# Patient Record
Sex: Female | Born: 1980 | Race: White | Hispanic: No | Marital: Married | Smoking: Never smoker
Health system: Southern US, Community
[De-identification: ages and names within clinical notes are randomized; demographics above are authoritative.]

## PROBLEM LIST (undated history)

## (undated) DIAGNOSIS — Z923 Personal history of irradiation: Secondary | ICD-10-CM

## (undated) DIAGNOSIS — T8859XA Other complications of anesthesia, initial encounter: Secondary | ICD-10-CM

## (undated) DIAGNOSIS — T4145XA Adverse effect of unspecified anesthetic, initial encounter: Secondary | ICD-10-CM

## (undated) DIAGNOSIS — R222 Localized swelling, mass and lump, trunk: Secondary | ICD-10-CM

---

## 2009-10-07 HISTORY — PX: LAPAROTOMY: SHX154

## 2010-08-17 ENCOUNTER — Ambulatory Visit (HOSPITAL_COMMUNITY): Admission: RE | Admit: 2010-08-17 | Discharge: 2010-08-17 | Payer: Self-pay

## 2011-03-20 IMAGING — US US FOLLICLE
1 series · 14 of 16 positions shown · non-contrast
Comparison: None.

CLINICAL DATA: Follicle study.  LMP 08/15/2010.  Not currently on
hormonal replacement or fertility medication

US PELVIS LIMITED OR FOLLOW UP

[Series 1: us follicle · 0.13mm/px · 27 acquisitions, 14 frames shown]
[im 1/27]
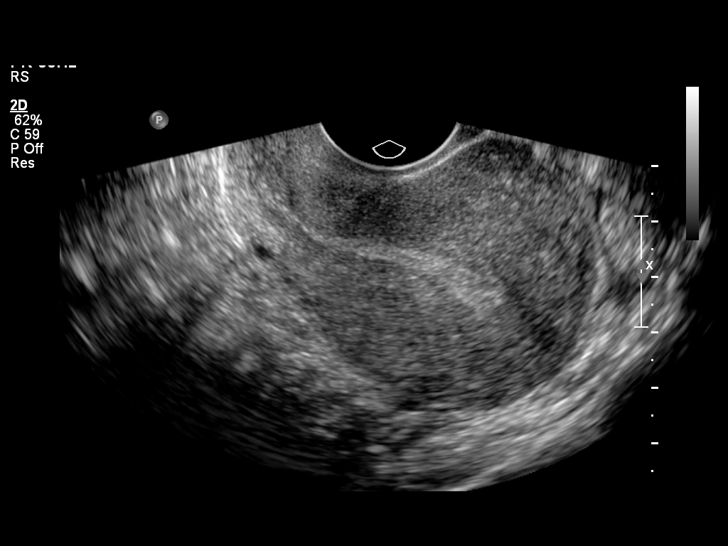
[im 2/27]
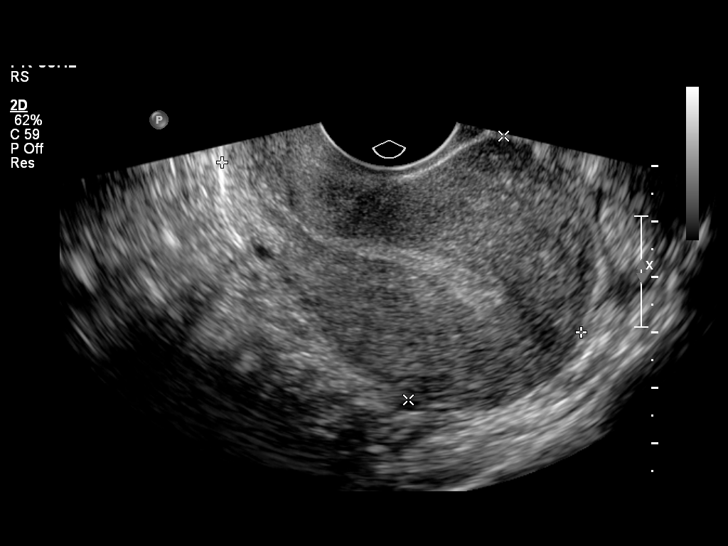
[im 4/27]
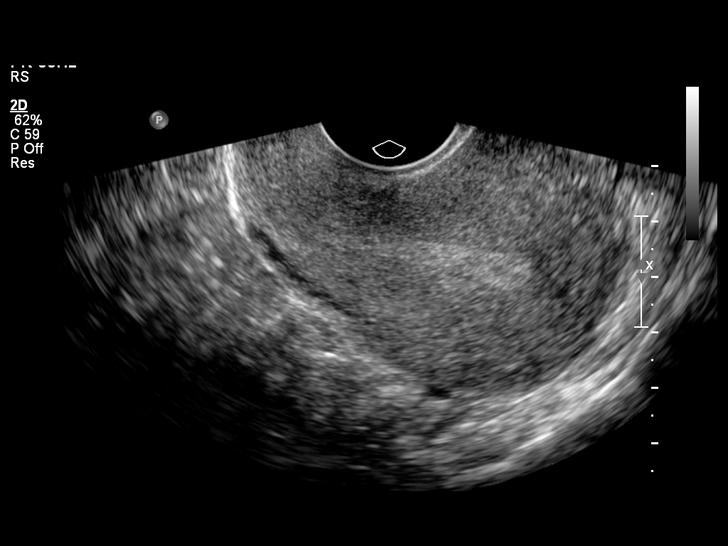
[im 7/27]
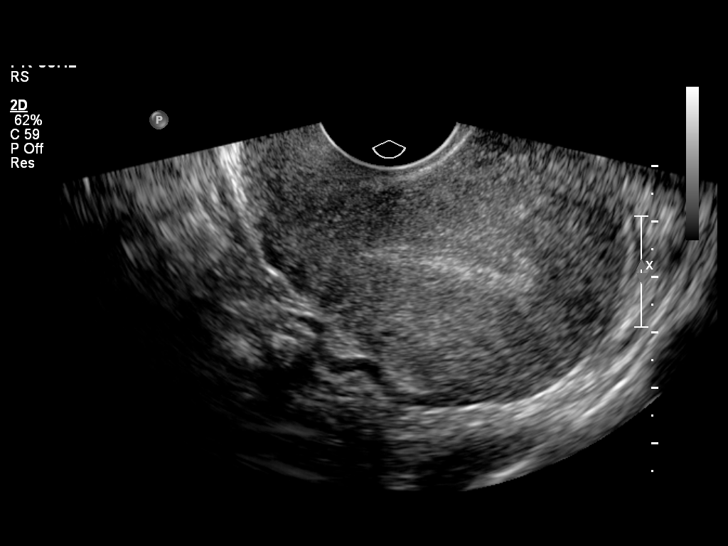
[im 9/27]
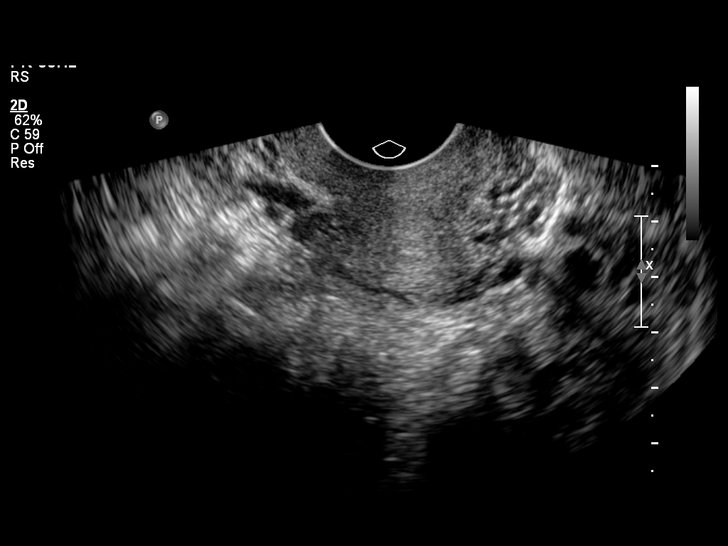
[im 11/27]
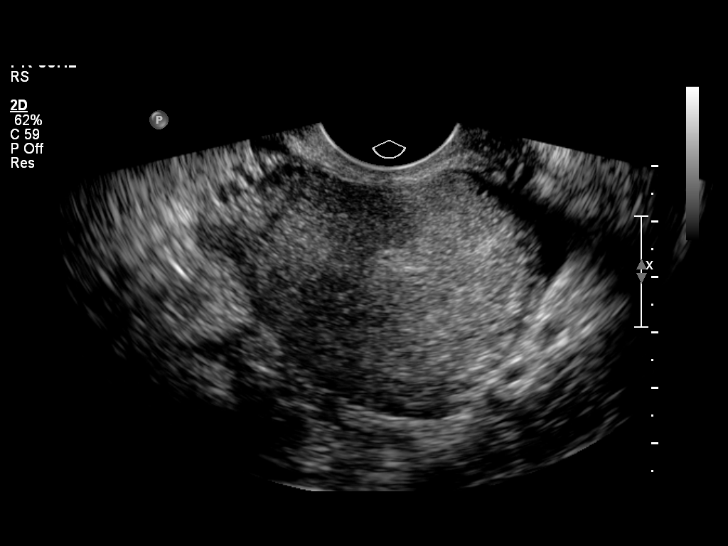
[im 13/27]
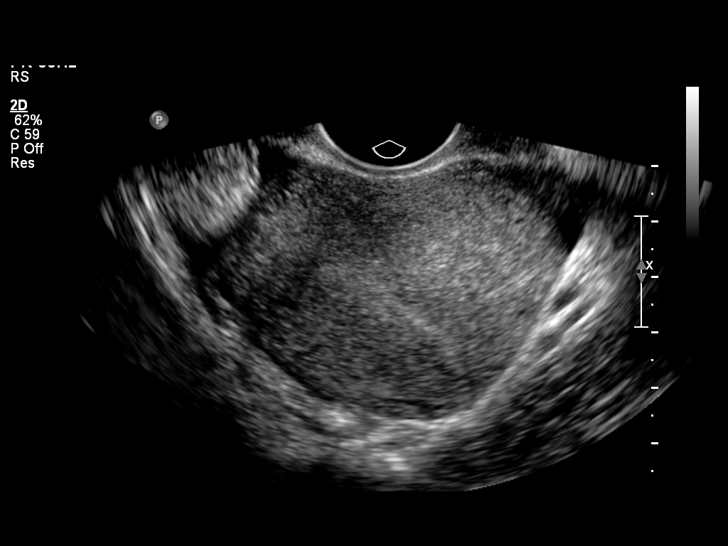
[im 14/27]
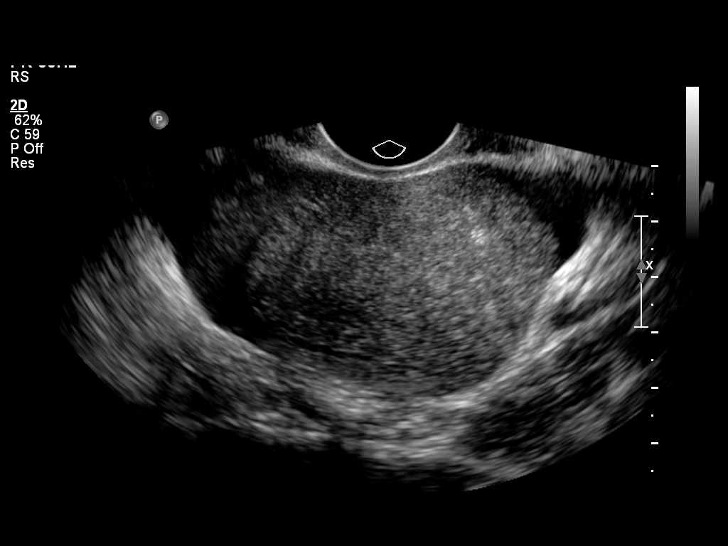
[im 16/27]
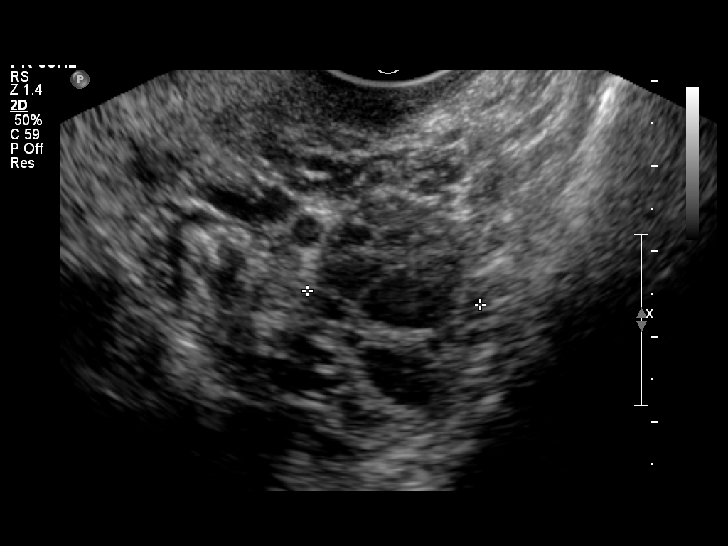
[im 18/27]
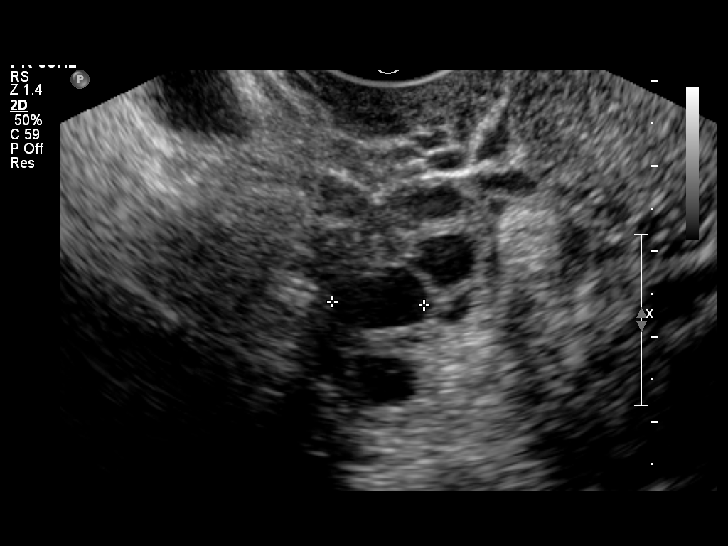
[im 21/27]
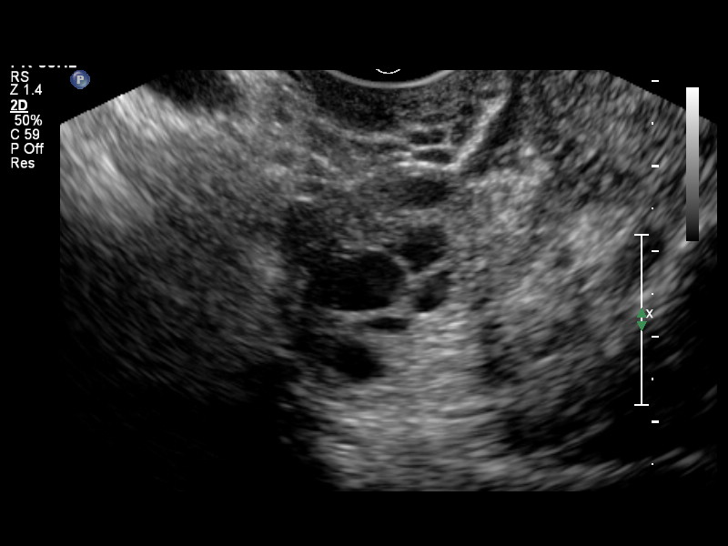
[im 23/27]
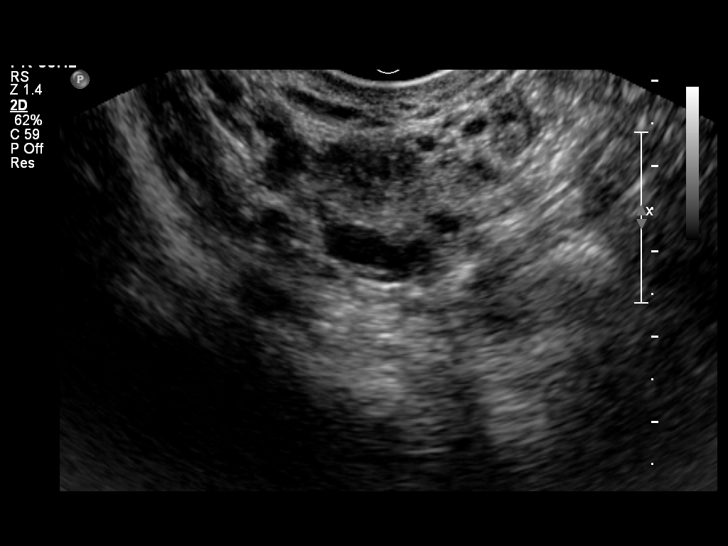
[im 25/27]
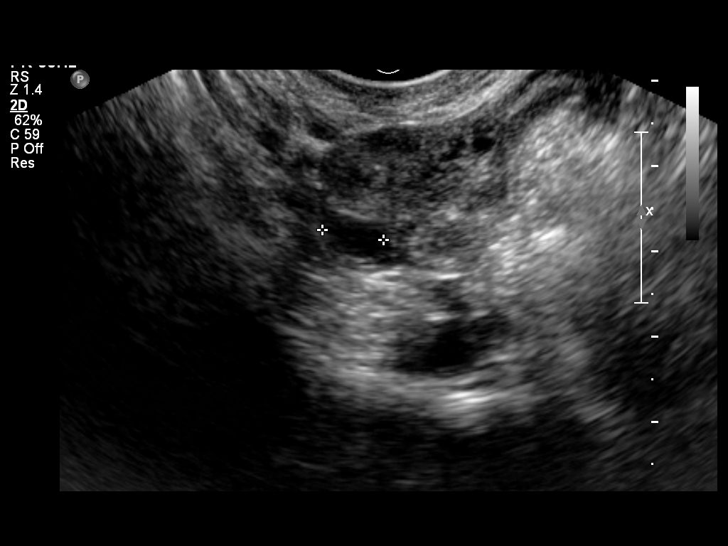
[im 27/27]
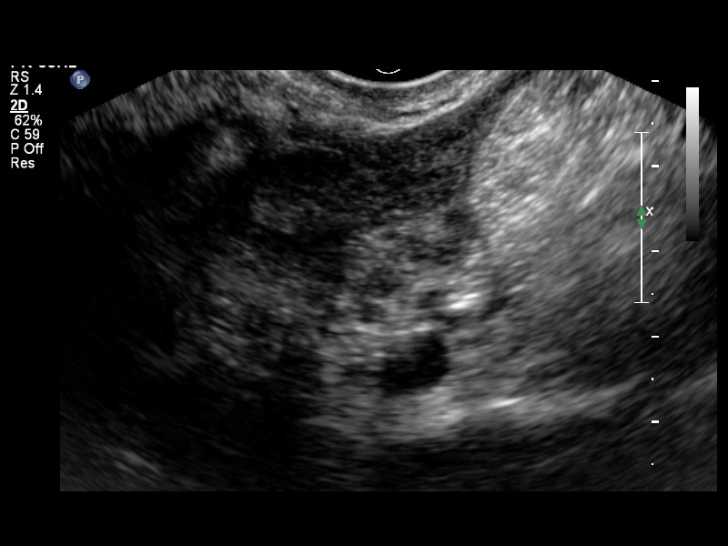

[14 of 16 positions shown; findings below may reference images not displayed]

FINDINGS: The uterus is retroverted in nature and demonstrates a
sagittal length of 7.2 cm, an AP depth of 5.1 cm and a transverse
width of 5.2 cm.  A homogeneous uterine myometrium is seen.

The endometrial lining is homogeneously echogenic with an AP width
of 7 mm.  No areas of focal thickening or heterogeneity are seen
and this would correlate with the patient's current menses.

The left ovary measures 2.9 x 1.8 x 1.9 cm and contains one
measurable follicle measuring 11 x 9 mm.  Several sub centimeter
follicles (6-7mm) are noted.

The right ovary measures 2.9 x 1.82 x 1.9 cm and contains three to
four sub centimeter follicles.

No pelvic fluid or separate adnexal masses are seen.
IMPRESSION: Normal secretory uterine myometrium and endometrium.

Normal ovaries with bilateral sub centimeter follicles.  One
measurable follicle noted on the left with size as described above.

## 2014-09-05 ENCOUNTER — Telehealth (INDEPENDENT_AMBULATORY_CARE_PROVIDER_SITE_OTHER): Payer: Self-pay

## 2014-09-05 DIAGNOSIS — R1900 Intra-abdominal and pelvic swelling, mass and lump, unspecified site: Secondary | ICD-10-CM

## 2014-09-05 NOTE — Telephone Encounter (Signed)
Pt seen in office today by Dr Dalbert Batman and orders placed in epic for abdominal u/s to eval. Lymph node vs.hernia.

## 2014-09-06 DIAGNOSIS — R222 Localized swelling, mass and lump, trunk: Secondary | ICD-10-CM

## 2014-09-06 HISTORY — DX: Localized swelling, mass and lump, trunk: R22.2

## 2014-09-09 ENCOUNTER — Encounter (INDEPENDENT_AMBULATORY_CARE_PROVIDER_SITE_OTHER): Payer: Self-pay

## 2014-09-09 ENCOUNTER — Other Ambulatory Visit (INDEPENDENT_AMBULATORY_CARE_PROVIDER_SITE_OTHER): Payer: Self-pay | Admitting: General Surgery

## 2014-09-09 ENCOUNTER — Ambulatory Visit
Admission: RE | Admit: 2014-09-09 | Discharge: 2014-09-09 | Disposition: A | Discharge status: Home / Self Care | Payer: PRIVATE HEALTH INSURANCE | Source: Ambulatory Visit | Attending: General Surgery | Admitting: General Surgery

## 2014-09-09 DIAGNOSIS — R1900 Intra-abdominal and pelvic swelling, mass and lump, unspecified site: Secondary | ICD-10-CM

## 2014-09-12 NOTE — Progress Notes (Signed)
Patient results gvn at appt today

## 2014-09-19 ENCOUNTER — Other Ambulatory Visit (INDEPENDENT_AMBULATORY_CARE_PROVIDER_SITE_OTHER): Payer: Self-pay | Admitting: General Surgery

## 2014-09-19 NOTE — Progress Notes (Signed)
Please put orders in Epic sign and held Same day surgery Wednesday, 09-28-14 Thanks

## 2014-09-21 ENCOUNTER — Encounter (HOSPITAL_COMMUNITY): Payer: Self-pay | Admitting: *Deleted

## 2014-09-24 NOTE — H&P (Signed)
Sheila Love  Location: [facility] Patient #: [id number] DOB: 1981-08-18 Married / Language: English / Race: White Female      History of Present Illness  Patient words: f/u abd ultrasound.      The patient is a 33 year old female who presents with a complaint of abdominal wall mass, right lower quadrant. This very pleasant 33 year old Caucasian female returns with her husband to discuss the right lower quadrant abdominal wall mass. This was noted in October. No trauma. No history of endometriosis but she has had a C-section. It is firm, nontender, cannot push it back in. Her father had small cell non-Hodgkin's lymphoma age 70 in remission Ultrasound shows a solid appearing 2.6 cm x 1.3 x 1.9 cm a continuous mass in the right lower quadrant. It does not look like a cyst, lipoma, or lymph node. It does have mild mass effect on the underlying muscles. Doesn't really look like a hernia, although close to spigelian hernia area. The radiologist offered MR imaging.     I told the patient and her husband that I thought this simply should be be excised and we could perform further imaging if it turns out to be a lymphoma or sarcoma. I have discussed indications, details, techniques, and numerous risk of the surgery with them. They're aware that we'll do this under general anesthesia as an outpatient with a transverse incision, possible muscle repair. Sent for pathology. They understand all the risks including bleeding, infection, hernia, nerve damage with chronic pain, reoperation if malignant, and other unforseen problems. They understand all these issues well. All the questions were answered. They agree with this plan.   Other Problems  Hemorrhoids Other disease, cancer, significant illness  Past Surgical History  Cesarean Section - Multiple Tonsillectomy  Diagnostic Studies History  Colonoscopy never Mammogram never Pap Smear 1-5 years  ago  Allergies  Amoxicillin *PENICILLINS*  Medication History  No Current Medications  Social History  Caffeine use Coffee. No alcohol use No drug use Tobacco use Never smoker.  Family History Arthritis Father. Cancer Father. Thyroid problems Father.  Pregnancy / Birth History  Age at menarche 75 years. Gravida 2 Irregular periods Maternal age 64-30 Para 2  Review of Systems General Not Present- Appetite Loss, Chills, Fatigue, Fever, Night Sweats, Weight Gain and Weight Loss. Skin Not Present- Change in Wart/Mole, Dryness, Hives, Jaundice, New Lesions, Non-Healing Wounds, Rash and Ulcer. HEENT Present- Wears glasses/contact lenses. Not Present- Earache, Hearing Loss, Hoarseness, Nose Bleed, Oral Ulcers, Ringing in the Ears, Seasonal Allergies, Sinus Pain, Sore Throat, Visual Disturbances and Yellow Eyes. Respiratory Not Present- Bloody sputum, Chronic Cough, Difficulty Breathing, Snoring and Wheezing. Breast Not Present- Breast Mass, Breast Pain, Nipple Discharge and Skin Changes. Cardiovascular Not Present- Chest Pain, Difficulty Breathing Lying Down, Leg Cramps, Palpitations, Rapid Heart Rate, Shortness of Breath and Swelling of Extremities. Female Genitourinary Not Present- Frequency, Nocturia, Painful Urination, Pelvic Pain and Urgency. Musculoskeletal Not Present- Back Pain, Joint Pain, Joint Stiffness, Muscle Pain, Muscle Weakness and Swelling of Extremities. Neurological Not Present- Decreased Memory, Fainting, Headaches, Numbness, Seizures, Tingling, Tremor, Trouble walking and Weakness. Psychiatric Not Present- Anxiety, Bipolar, Change in Sleep Pattern, Depression, Fearful and Frequent crying. Endocrine Not Present- Cold Intolerance, Excessive Hunger, Hair Changes, Heat Intolerance, Hot flashes and New Diabetes. Hematology Not Present- Easy Bruising, Excessive bleeding, Gland problems, HIV and Persistent Infections.   Vitals  09/12/2014 3:25 PM Weight:  135 lb Height: 67in Body Surface Area: 1.7 m Body Mass Index: 21.14 kg/m  Temp.: 6F(Temporal)  Pulse: 68 (Regular)  BP: 112/70 (Sitting, Left Arm, Standard)    Physical Exam General Note: Alert. Healthy-appearing. Pleasant. Cooperative.   Head and Neck Note: No adenopathy or mass   Chest and Lung Exam Note: Clear to auscultation bilaterally  Cardiovascular Note: Regular rate and rhythm. No murmur. No ectopy.   Abdomen Note: 2.5 x 1.5 cm firm mass right lower quadrant. Well above Pfannenstiel incision. Well above the inguinal canal. Not sure if this is fixed to the fascia or not. Does not reduce. Nontender. I do not detect an inguinal or femoral hernia. There is no inguinal adenopathy detected.     Assessment & Plan  ABDOMINAL WALL MASS OF RIGHT LOWER QUADRANT (789.33  R19.03) Current Plans  Schedule for Surgery The ultrasound shows a 2.6 cm solid subcutaneous mass. This does not look like a cyst or a lymph node or a lipoma. It is probably not a hernia but I cannot rule that out. I think the next step is to simply excise this under general anesthesia as an outpatient we have discussed the differential diagnosis, including benign and malignant possibilities We have discussed the techniques and the risks of this surgery. We will schedule this to be done before the end of the year, as we discussed  HISTORY OF C-SECTION (V45.89  Z98.89) FAMILY HISTORY OF LYMPHOMA (V16.7  Z80.2)    Sheila Love M. Dalbert Batman, M.D., [facility] General and Minimally invasive Surgery Breast and Colorectal Surgery Office:   [phone] Pager:   [phone]

## 2014-09-26 ENCOUNTER — Encounter (HOSPITAL_COMMUNITY): Payer: Self-pay | Admitting: *Deleted

## 2014-09-26 NOTE — Progress Notes (Signed)
Spoke with Sheila Love she had a few questions to ask about her her surgery when to arrive.  Explained told that she needed to arrive three hours prior to her surgery.  She gave me additional information to add to her medical history.

## 2014-09-28 ENCOUNTER — Encounter (HOSPITAL_COMMUNITY): Payer: Self-pay | Admitting: Anesthesiology

## 2014-09-28 ENCOUNTER — Ambulatory Visit (HOSPITAL_COMMUNITY)
Admission: RE | Admit: 2014-09-28 | Discharge: 2014-09-28 | Disposition: A | Discharge status: Home / Self Care | Payer: No Typology Code available for payment source | Source: Ambulatory Visit | Attending: General Surgery | Admitting: General Surgery

## 2014-09-28 ENCOUNTER — Ambulatory Visit (HOSPITAL_COMMUNITY): Payer: No Typology Code available for payment source | Admitting: Anesthesiology

## 2014-09-28 ENCOUNTER — Encounter (HOSPITAL_COMMUNITY)
Admission: RE | Disposition: A | Discharge status: Home / Self Care | Payer: Self-pay | Source: Ambulatory Visit | Attending: General Surgery

## 2014-09-28 DIAGNOSIS — D235 Other benign neoplasm of skin of trunk: Secondary | ICD-10-CM | POA: Insufficient documentation

## 2014-09-28 DIAGNOSIS — R1909 Other intra-abdominal and pelvic swelling, mass and lump: Secondary | ICD-10-CM | POA: Diagnosis present

## 2014-09-28 DIAGNOSIS — R1903 Right lower quadrant abdominal swelling, mass and lump: Secondary | ICD-10-CM | POA: Diagnosis present

## 2014-09-28 HISTORY — DX: Localized swelling, mass and lump, trunk: R22.2

## 2014-09-28 HISTORY — PX: RESECTION OF ABDOMINAL MASS: SHX6450

## 2014-09-28 HISTORY — DX: Adverse effect of unspecified anesthetic, initial encounter: T41.45XA

## 2014-09-28 HISTORY — DX: Other complications of anesthesia, initial encounter: T88.59XA

## 2014-09-28 LAB — PREGNANCY, URINE: PREG TEST UR: NEGATIVE

## 2014-09-28 SURGERY — RESECTION OF ABDOMINAL MASS
Anesthesia: General | Site: Abdomen | Laterality: Right

## 2014-09-28 MED ORDER — ONDANSETRON HCL 4 MG/2ML IJ SOLN: 4.0000 mg | Freq: Once | INTRAMUSCULAR | Status: DC | PRN

## 2014-09-28 MED ORDER — LIDOCAINE HCL (CARDIAC) 20 MG/ML IV SOLN
INTRAVENOUS | Status: AC
  Filled 2014-09-28: qty 5

## 2014-09-28 MED ORDER — ACETAMINOPHEN 650 MG RE SUPP
650.0000 mg | RECTAL | Status: DC | PRN
  Filled 2014-09-28: qty 1

## 2014-09-28 MED ORDER — ONDANSETRON HCL 4 MG/2ML IJ SOLN
INTRAMUSCULAR | Status: AC
  Filled 2014-09-28: qty 2

## 2014-09-28 MED ORDER — ONDANSETRON HCL 4 MG/2ML IJ SOLN
INTRAMUSCULAR | Status: DC | PRN
  Administered 2014-09-28: 4 mg via INTRAVENOUS

## 2014-09-28 MED ORDER — FENTANYL CITRATE 0.05 MG/ML IJ SOLN: 25.0000 ug | INTRAMUSCULAR | Status: DC | PRN

## 2014-09-28 MED ORDER — CIPROFLOXACIN IN D5W 400 MG/200ML IV SOLN
400.0000 mg | INTRAVENOUS | Status: AC
Start: 2014-09-28 — End: 2014-09-28
  Administered 2014-09-28: 400 mg via INTRAVENOUS

## 2014-09-28 MED ORDER — CEFAZOLIN SODIUM-DEXTROSE 2-3 GM-% IV SOLR: 2.0000 g | INTRAVENOUS | Status: DC

## 2014-09-28 MED ORDER — LACTATED RINGERS IV SOLN: INTRAVENOUS | Status: DC

## 2014-09-28 MED ORDER — MIDAZOLAM HCL 5 MG/5ML IJ SOLN
INTRAMUSCULAR | Status: DC | PRN
  Administered 2014-09-28: 2 mg via INTRAVENOUS

## 2014-09-28 MED ORDER — CHLORHEXIDINE GLUCONATE 4 % EX LIQD: 1.0000 "application " | Freq: Once | CUTANEOUS | Status: DC

## 2014-09-28 MED ORDER — DEXAMETHASONE SODIUM PHOSPHATE 10 MG/ML IJ SOLN
INTRAMUSCULAR | Status: DC | PRN
  Administered 2014-09-28: 10 mg via INTRAVENOUS

## 2014-09-28 MED ORDER — LACTATED RINGERS IV SOLN
INTRAVENOUS | Status: DC | PRN
  Administered 2014-09-28: 08:00:00 via INTRAVENOUS

## 2014-09-28 MED ORDER — MIDAZOLAM HCL 2 MG/2ML IJ SOLN
INTRAMUSCULAR | Status: AC
  Filled 2014-09-28: qty 2

## 2014-09-28 MED ORDER — SODIUM CHLORIDE 0.9 % IV SOLN: 250.0000 mL | INTRAVENOUS | Status: DC | PRN

## 2014-09-28 MED ORDER — BUPIVACAINE-EPINEPHRINE 0.5% -1:200000 IJ SOLN
INTRAMUSCULAR | Status: DC | PRN
  Administered 2014-09-28: 8 mL

## 2014-09-28 MED ORDER — SODIUM CHLORIDE 0.9 % IR SOLN
Status: DC | PRN
  Administered 2014-09-28: 1000 mL

## 2014-09-28 MED ORDER — SODIUM CHLORIDE 0.9 % IJ SOLN: 3.0000 mL | INTRAMUSCULAR | Status: DC | PRN

## 2014-09-28 MED ORDER — FENTANYL CITRATE 0.05 MG/ML IJ SOLN
INTRAMUSCULAR | Status: DC | PRN
  Administered 2014-09-28 (×4): 25 ug via INTRAVENOUS

## 2014-09-28 MED ORDER — HYDROCODONE-ACETAMINOPHEN 5-325 MG PO TABS: 1.0000 | ORAL_TABLET | Freq: Four times a day (QID) | ORAL | Status: DC | PRN

## 2014-09-28 MED ORDER — SODIUM CHLORIDE 0.9 % IJ SOLN: 3.0000 mL | Freq: Two times a day (BID) | INTRAMUSCULAR | Status: DC

## 2014-09-28 MED ORDER — CIPROFLOXACIN IN D5W 400 MG/200ML IV SOLN
INTRAVENOUS | Status: AC
  Filled 2014-09-28: qty 200

## 2014-09-28 MED ORDER — ACETAMINOPHEN 325 MG PO TABS: 650.0000 mg | ORAL_TABLET | ORAL | Status: DC | PRN

## 2014-09-28 MED ORDER — PROPOFOL 10 MG/ML IV BOLUS
INTRAVENOUS | Status: AC
  Filled 2014-09-28: qty 20

## 2014-09-28 MED ORDER — OXYCODONE HCL 5 MG PO TABS
5.0000 mg | ORAL_TABLET | ORAL | Status: DC | PRN
  Administered 2014-09-28: 5 mg via ORAL
  Filled 2014-09-28: qty 1

## 2014-09-28 MED ORDER — SODIUM CHLORIDE 0.9 % IV SOLN: INTRAVENOUS | Status: DC

## 2014-09-28 MED ORDER — FENTANYL CITRATE 0.05 MG/ML IJ SOLN
INTRAMUSCULAR | Status: AC
Start: 2014-09-28 — End: 2014-09-28
  Filled 2014-09-28: qty 2

## 2014-09-28 MED ORDER — BUPIVACAINE-EPINEPHRINE (PF) 0.5% -1:200000 IJ SOLN
INTRAMUSCULAR | Status: AC
  Filled 2014-09-28: qty 30

## 2014-09-28 MED ORDER — DEXAMETHASONE SODIUM PHOSPHATE 10 MG/ML IJ SOLN
INTRAMUSCULAR | Status: AC
  Filled 2014-09-28: qty 1

## 2014-09-28 SURGICAL SUPPLY — 38 items
APPLICATOR COTTON TIP 6IN STRL (MISCELLANEOUS) IMPLANT
BLADE EXTENDED COATED 6.5IN (ELECTRODE) IMPLANT
BLADE HEX COATED 2.75 (ELECTRODE) ×2 IMPLANT
COVER MAYO STAND STRL (DRAPES) IMPLANT
DERMABOND ADVANCED (GAUZE/BANDAGES/DRESSINGS) ×1
DERMABOND ADVANCED .7 DNX12 (GAUZE/BANDAGES/DRESSINGS) ×1 IMPLANT
DRAPE LAPAROSCOPIC ABDOMINAL (DRAPES) ×2 IMPLANT
DRAPE WARM FLUID 44X44 (DRAPE) IMPLANT
ELECT REM PT RETURN 9FT ADLT (ELECTROSURGICAL) ×2
ELECTRODE REM PT RTRN 9FT ADLT (ELECTROSURGICAL) ×1 IMPLANT
GAUZE SPONGE 4X4 12PLY STRL (GAUZE/BANDAGES/DRESSINGS) IMPLANT
GLOVE BIOGEL PI IND STRL 7.0 (GLOVE) ×1 IMPLANT
GLOVE BIOGEL PI INDICATOR 7.0 (GLOVE) ×1
GLOVE EUDERMIC 7 POWDERFREE (GLOVE) ×2 IMPLANT
GOWN STRL REUS W/TWL LRG LVL3 (GOWN DISPOSABLE) ×2 IMPLANT
GOWN STRL REUS W/TWL XL LVL3 (GOWN DISPOSABLE) ×4 IMPLANT
KIT BASIN OR (CUSTOM PROCEDURE TRAY) ×2 IMPLANT
NS IRRIG 1000ML POUR BTL (IV SOLUTION) ×2 IMPLANT
PACK GENERAL/GYN (CUSTOM PROCEDURE TRAY) ×2 IMPLANT
SPONGE LAP 18X18 X RAY DECT (DISPOSABLE) IMPLANT
STAPLER VISISTAT 35W (STAPLE) ×2 IMPLANT
SUCTION POOLE TIP (SUCTIONS) IMPLANT
SUT MNCRL AB 4-0 PS2 18 (SUTURE) ×2 IMPLANT
SUT NOVA NAB GS-21 1 T12 (SUTURE) ×4 IMPLANT
SUT PDS AB 1 CTX 36 (SUTURE) IMPLANT
SUT PDS AB 1 TP1 96 (SUTURE) IMPLANT
SUT SILK 2 0 (SUTURE)
SUT SILK 2 0 SH CR/8 (SUTURE) IMPLANT
SUT SILK 2-0 18XBRD TIE 12 (SUTURE) IMPLANT
SUT SILK 3 0 (SUTURE)
SUT SILK 3 0 SH CR/8 (SUTURE) IMPLANT
SUT SILK 3-0 18XBRD TIE 12 (SUTURE) IMPLANT
SUT VIC AB 3-0 SH 18 (SUTURE) ×2 IMPLANT
SUT VICRYL 2 0 18  UND BR (SUTURE)
SUT VICRYL 2 0 18 UND BR (SUTURE) IMPLANT
TOWEL OR 17X26 10 PK STRL BLUE (TOWEL DISPOSABLE) ×4 IMPLANT
TRAY FOLEY CATH 14FRSI W/METER (CATHETERS) IMPLANT
YANKAUER SUCT BULB TIP NO VENT (SUCTIONS) IMPLANT

## 2014-09-28 NOTE — Interval H&P Note (Signed)
History and Physical Interval Note:  09/28/2014 6:58 AM  Sheila Love  has presented today for surgery, with the diagnosis of abdominal wall mass RLQ  The various methods of treatment have been discussed with the patient and family. After consideration of risks, benefits and other options for treatment, the patient has consented to  Procedure(s): EXCISE RIGHT LOWER QUADRANT  ABDOMINAL WALL MASS (Right) as a surgical intervention .  The patient's history has been reviewed, patient examined, no change in status, stable for surgery.  I have reviewed the patient's chart and labs.  Questions were answered to the patient's satisfaction.     [name]

## 2014-09-28 NOTE — Transfer of Care (Signed)
Immediate Anesthesia Transfer of Care Note  Patient: Sheila Love  Procedure(s) Performed: Procedure(s) (LRB): EXCISE 2CM  ABDOMINAL WALL MASS (Right)  Patient Location: PACU  Anesthesia Type: General  Level of Consciousness: sedated, patient cooperative and responds to stimulation  Airway & Oxygen Therapy: Patient Spontanous Breathing and Patient connected to face mask oxgen  Post-op Assessment: Report given to PACU RN and Post -op Vital signs reviewed and stable  Post vital signs: Reviewed and stable  Complications: No apparent anesthesia complications

## 2014-09-28 NOTE — Discharge Instructions (Signed)
Ice pack intermittently for 24 hours.  Stay-at-home in the house for the rest of the day and night tonight.  You may shower, starting tomorrow  No sports, running, or sit ups for 1 month since we had to repair the muscle  Return to see Dr. Dalbert Batman in 2 weeks. Please call to set up the appointment  We will call the pathology report to you as soon as we get it This might be tomorrow or we may have to wait until Monday.  You may walk up and down stairs. You may walk around the block every day. You may drive your car when you are comfortable.

## 2014-09-28 NOTE — Anesthesia Preprocedure Evaluation (Signed)
Anesthesia Evaluation  Patient identified by MRN, date of birth, ID band Patient awake    Reviewed: Allergy & Precautions, H&P , NPO status , Patient's Chart, lab work & pertinent test results  History of Anesthesia Complications Negative for: history of anesthetic complications  Airway Mallampati: II  TM Distance: >3 FB Neck ROM: Full    Dental no notable dental hx. (+) Dental Advisory Given   Pulmonary neg pulmonary ROS,  breath sounds clear to auscultation  Pulmonary exam normal       Cardiovascular Exercise Tolerance: Good negative cardio ROS  Rhythm:Regular Rate:Normal     Neuro/Psych negative neurological ROS  negative psych ROS   GI/Hepatic negative GI ROS, Neg liver ROS,   Endo/Other  negative endocrine ROS  Renal/GU negative Renal ROS  negative genitourinary   Musculoskeletal negative musculoskeletal ROS (+)   Abdominal   Peds negative pediatric ROS (+)  Hematology negative hematology ROS (+)   Anesthesia Other Findings   Reproductive/Obstetrics negative OB ROS                             Anesthesia Physical Anesthesia Plan  ASA: I  Anesthesia Plan: General   Post-op Pain Management:    Induction: Intravenous  Airway Management Planned: LMA  Additional Equipment:   Intra-op Plan:   Post-operative Plan: Extubation in OR  Informed Consent: I have reviewed the patients History and Physical, chart, labs and discussed the procedure including the risks, benefits and alternatives for the proposed anesthesia with the patient or authorized representative who has indicated his/her understanding and acceptance.   Dental advisory given  Plan Discussed with: CRNA  Anesthesia Plan Comments:         Anesthesia Quick Evaluation

## 2014-09-28 NOTE — Anesthesia Postprocedure Evaluation (Signed)
  Anesthesia Post-op Note  Patient: Sheila Love  Procedure(s) Performed: Procedure(s) (LRB): EXCISE 2CM  ABDOMINAL WALL MASS (Right)  Patient Location: PACU  Anesthesia Type: General  Level of Consciousness: awake and alert   Airway and Oxygen Therapy: Patient Spontanous Breathing  Post-op Pain: mild  Post-op Assessment: Post-op Vital signs reviewed, Patient's Cardiovascular Status Stable, Respiratory Function Stable, Patent Airway and No signs of Nausea or vomiting  Last Vitals:  Filed Vitals:   09/28/14 1007  BP: 97/75  Pulse: 56  Temp: 36.5 C  Resp: 14    Post-op Vital Signs: stable   Complications: No apparent anesthesia complications

## 2014-09-28 NOTE — Op Note (Signed)
Patient Name:           Sheila Love   Date of Surgery:        09/28/2014  Pre op Diagnosis:      2 cm abdominal wall mass, intramuscular  Post op Diagnosis:    Same  Procedure:                 Excision 2 centimeter abdominal wall mass  Surgeon:                     Edsel Petrin. Dalbert Batman, M.D., FACS  Assistant:                      OR staff  Operative Indications:    The patient is a 33 year old female who presents with a complaint of abdominal wall mass, right lower quadrant. This was noted in October. No trauma. No history of endometriosis but she has had two C-sections.   It is firm, nontender, cannot push it back in. Her father had small cell non-Hodgkin's lymphoma age 62 in remission Ultrasound shows a solid appearing 2.6 cm x 1.3 x 1.9 cm a continuous mass in the right lower quadrant. It does not look like a cyst, lipoma, or lymph node. It does have mild mass effect on the underlying muscles. Doesn't really look like a hernia, although close to spigelian hernia area. The radiologist offered MR imaging.  I told the patient and her husband that I thought this simply should be be excised and we could perform further imaging if it turns out to be a lymphoma or sarcoma.  Operative Findings:       There was a firm, well marginated, fibrotic-appearing mass in the right lower quadrant of the abdominal wall. This was immediately behind the anterior rectus sheath but did not appear invasive. On review in pathology on cut section it looks fibrotic, suggesting endometrioma.  Procedure in Detail:          Following the induction of general LMA anesthesia the patient's abdomen was prepped and draped in a sterile fashion. Intravenous antibiotics were given. Surgical timeout was performed. 0.5% Marcaine with epinephrine was used as local infiltration anesthetic. A transverse incision was made in the right lower quadrant overlying the mass. Dissection was carried down into subcutaneous tissue  and dissected around the mass circumferentially. Once I determined that it was subfascial I incised the fascia all the way around this and then found that the mass easily dissected away from the rectus muscle. It was sent to the lab for gross examination and histologic examination. I discussed the case with pathology. Hemostasis was excellent and achieved withelectrocautery. The anterior rectus sheath was closed transversely with interrupted sutures of #1 Novafil. The subcutaneous tissue was closed with interrupted 3-0 Vicryl sutures and the skin closed with a running subcuticular 4-0 Monocryl and Dermabond. The patient tolerated the procedure well was taken to PACU in stable condition. EBL 15 mL. Counts correct. Complications none.     Edsel Petrin. Dalbert Batman, M.D., FACS General and Minimally Invasive Surgery Breast and Colorectal Surgery  09/28/2014 9:24 AM

## 2014-09-29 ENCOUNTER — Encounter (HOSPITAL_COMMUNITY): Payer: Self-pay | Admitting: General Surgery

## 2014-10-01 ENCOUNTER — Telehealth (INDEPENDENT_AMBULATORY_CARE_PROVIDER_SITE_OTHER): Payer: Self-pay | Admitting: General Surgery

## 2014-10-01 NOTE — Telephone Encounter (Signed)
Tried to call pathology report. Listed number did not work.  Sheila Love

## 2014-10-03 ENCOUNTER — Telehealth (INDEPENDENT_AMBULATORY_CARE_PROVIDER_SITE_OTHER): Payer: Self-pay | Admitting: General Surgery

## 2014-10-03 NOTE — Telephone Encounter (Signed)
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  According to Dr. Nicoletta Dress  this appears to be completely excised. Further immunostains will be done to further classify.  I discussed this with the patient and she seemed appreciative.  She has an appointment to see me in mid January.  Edsel Petrin. Dalbert Batman, M.D., [facility] General and Minimally invasive Surgery Breast and Colorectal Surgery Office:   [phone] Pager:   [phone]

## 2014-10-12 ENCOUNTER — Encounter (HOSPITAL_COMMUNITY): Payer: Self-pay

## 2014-10-21 ENCOUNTER — Other Ambulatory Visit (INDEPENDENT_AMBULATORY_CARE_PROVIDER_SITE_OTHER): Payer: Self-pay | Admitting: General Surgery

## 2014-11-09 ENCOUNTER — Encounter (HOSPITAL_COMMUNITY): Payer: Self-pay | Admitting: *Deleted

## 2014-11-14 NOTE — H&P (Signed)
  Sheila Love  Location: [facility] Patient #: [id number] DOB: 20-Aug-1981 Married / Language: English / Race: White Female       History of Present Illness  Patient words: F/U RT Abd mass.  The patient is a 34 year old female who presents with a complaint of deep fibromatosis of abdominal wall. This patient underwent excision of a 2.5 cm abdominal wall mass in the right lower quadrant of the abdominal wall immediately behind the anterior rectus sheath on 09/28/14. 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 The patient has had no problems with wound healing I have explained the risk of recurrence with her. I discussed options of wider resection for wider margins, radiation therapy and observation. Surgery is recommended. Both she and her husband have done lots of research and would like to have this area reexcised. 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 I told her to ask her PCP to refer her to a gastroenterologist electively for consideration of colonoscopy. They're traveling back to [place], in June when he teaches [place]. They would like to have the surgery and get this done. I discussed the indications, details, techniques, and numerous risk of the surgery with him. They're aware of the risk of bleeding, infection, wound hernia, possible mesh insertion, continued positive margin which may need radiation therapy, and other unforeseen problems. They understand all these issues and all of their questions were answered. They agree with this plan. Date of that I will be excising full thickness of the abdominal wall. They know that she will need to be admitted overnight.   Allergies  Amoxicillin *PENICILLINS*  Medication History  No Current Medications  Vitals   Weight: 136.38 lb Height: 67in Body Surface Area: 1.71 m Body Mass Index: 21.36 kg/m Temp.:  97.81F(Temporal)  Pulse: 60 (Regular)  Resp.: 16 (Unlabored)  BP: 118/66 (Sitting, Left Arm, Standard)    Physical Exam  General Note: Alert. Pleasant. Healthy. No distress   Head and Neck Note: No adenopathy or mass   Chest and Lung Exam Note: Clear to auscultation bilaterally   Cardiovascular Note: Regular rate and rhythm. No murmur. No ectopy   Abdomen Note: Soft. Flat. Nontender. Well-healed transverse scar right lower quadrant.     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 There is a focally positive margin. This has a significant risk of recurrence. We have discussed the options of observation, resection, and radiation therapy. Surgery is recommended. Please have your primary care doctor refer you to a gastroenterologist for a colonoscopy at some point because of the slight association with Gardner's syndrome. We will plan to re-excise this area, full-thickness of the abdominal wall with either primary closure or reinforced closure with mesh. We have discussed techniques and risks of this surgery in detail. HISTORY OF C-SECTION (V45.89  Z98.89) FAMILY HISTORY OF LYMPHOMA (V16.7  Z80.2)   Ikia Cincotta M. Dalbert Batman, M.D., [facility] General and Minimally invasive Surgery Breast and Colorectal Surgery Office:   [phone] Pager:   [phone]

## 2014-11-16 ENCOUNTER — Ambulatory Visit (HOSPITAL_COMMUNITY): Payer: 59 | Admitting: Anesthesiology

## 2014-11-16 ENCOUNTER — Encounter (HOSPITAL_COMMUNITY)
Admission: RE | Disposition: A | Discharge status: Home / Self Care | Payer: Self-pay | Source: Ambulatory Visit | Attending: General Surgery

## 2014-11-16 ENCOUNTER — Ambulatory Visit (HOSPITAL_COMMUNITY)
Admission: RE | Admit: 2014-11-16 | Discharge: 2014-11-17 | Disposition: A | Discharge status: Home / Self Care | Payer: 59 | Source: Ambulatory Visit | Attending: General Surgery | Admitting: General Surgery

## 2014-11-16 ENCOUNTER — Encounter (HOSPITAL_COMMUNITY): Payer: Self-pay | Admitting: *Deleted

## 2014-11-16 DIAGNOSIS — D481 Neoplasm of uncertain behavior of connective and other soft tissue: Secondary | ICD-10-CM | POA: Diagnosis present

## 2014-11-16 DIAGNOSIS — R1903 Right lower quadrant abdominal swelling, mass and lump: Secondary | ICD-10-CM | POA: Diagnosis present

## 2014-11-16 HISTORY — PX: ABDOMINAL WALL DEFECT REPAIR: SHX53

## 2014-11-16 HISTORY — PX: INSERTION OF MESH: SHX5868

## 2014-11-16 LAB — CBC
HCT: 35.8 % — ABNORMAL LOW (ref 36.0–46.0)
HEMOGLOBIN: 12 g/dL (ref 12.0–15.0)
MCH: 30.1 pg (ref 26.0–34.0)
MCHC: 33.5 g/dL (ref 30.0–36.0)
MCV: 89.7 fL (ref 78.0–100.0)
Platelets: 219 10*3/uL (ref 150–400)
RBC: 3.99 MIL/uL (ref 3.87–5.11)
RDW: 13.3 % (ref 11.5–15.5)
WBC: 4.6 10*3/uL (ref 4.0–10.5)

## 2014-11-16 LAB — CREATININE, SERUM
CREATININE: 0.72 mg/dL (ref 0.50–1.10)
GFR calc non Af Amer: >90 mL/min (ref >90)

## 2014-11-16 LAB — HCG, SERUM, QUALITATIVE: PREG SERUM: NEGATIVE

## 2014-11-16 SURGERY — REPAIR, ABDOMINAL WALL
Anesthesia: General | Site: Abdomen

## 2014-11-16 MED ORDER — PROMETHAZINE HCL 25 MG/ML IJ SOLN: 6.2500 mg | INTRAMUSCULAR | Status: DC | PRN

## 2014-11-16 MED ORDER — BUPIVACAINE-EPINEPHRINE 0.5% -1:200000 IJ SOLN
INTRAMUSCULAR | Status: DC | PRN
  Administered 2014-11-16: 10 mL

## 2014-11-16 MED ORDER — BUPIVACAINE-EPINEPHRINE (PF) 0.5% -1:200000 IJ SOLN
INTRAMUSCULAR | Status: AC
  Filled 2014-11-16: qty 30

## 2014-11-16 MED ORDER — LACTATED RINGERS IV SOLN
INTRAVENOUS | Status: DC
  Administered 2014-11-16 – 2014-11-17 (×2): via INTRAVENOUS

## 2014-11-16 MED ORDER — VANCOMYCIN HCL IN DEXTROSE 1-5 GM/200ML-% IV SOLN
INTRAVENOUS | Status: AC
  Filled 2014-11-16: qty 200

## 2014-11-16 MED ORDER — LACTATED RINGERS IV SOLN
INTRAVENOUS | Status: DC | PRN
  Administered 2014-11-16: 09:00:00 via INTRAVENOUS

## 2014-11-16 MED ORDER — HYDROMORPHONE HCL 1 MG/ML IJ SOLN
1.0000 mg | INTRAMUSCULAR | Status: DC | PRN
  Administered 2014-11-16: 1 mg via INTRAVENOUS
  Filled 2014-11-16: qty 1

## 2014-11-16 MED ORDER — PROPOFOL 10 MG/ML IV BOLUS
INTRAVENOUS | Status: DC | PRN
  Administered 2014-11-16: 120 mg via INTRAVENOUS

## 2014-11-16 MED ORDER — MIDAZOLAM HCL 2 MG/2ML IJ SOLN
INTRAMUSCULAR | Status: AC
  Filled 2014-11-16: qty 2

## 2014-11-16 MED ORDER — DEXAMETHASONE SODIUM PHOSPHATE 10 MG/ML IJ SOLN
INTRAMUSCULAR | Status: AC
  Filled 2014-11-16: qty 1

## 2014-11-16 MED ORDER — ONDANSETRON HCL 4 MG PO TABS: 4.0000 mg | ORAL_TABLET | Freq: Four times a day (QID) | ORAL | Status: DC | PRN

## 2014-11-16 MED ORDER — VANCOMYCIN HCL IN DEXTROSE 1-5 GM/200ML-% IV SOLN
1000.0000 mg | Freq: Once | INTRAVENOUS | Status: AC
  Administered 2014-11-16: 1000 mg via INTRAVENOUS
  Filled 2014-11-16: qty 200

## 2014-11-16 MED ORDER — OXYCODONE-ACETAMINOPHEN 5-325 MG PO TABS
1.0000 | ORAL_TABLET | ORAL | Status: DC | PRN
  Administered 2014-11-16 – 2014-11-17 (×2): 2 via ORAL
  Filled 2014-11-16 (×2): qty 2

## 2014-11-16 MED ORDER — PROPOFOL 10 MG/ML IV BOLUS
INTRAVENOUS | Status: AC
  Filled 2014-11-16: qty 20

## 2014-11-16 MED ORDER — ENOXAPARIN SODIUM 40 MG/0.4ML ~~LOC~~ SOLN
40.0000 mg | SUBCUTANEOUS | Status: DC
  Administered 2014-11-17: 40 mg via SUBCUTANEOUS
  Filled 2014-11-16 (×2): qty 0.4

## 2014-11-16 MED ORDER — LACTATED RINGERS IV SOLN: INTRAVENOUS | Status: DC

## 2014-11-16 MED ORDER — FENTANYL CITRATE 0.05 MG/ML IJ SOLN
INTRAMUSCULAR | Status: DC | PRN
  Administered 2014-11-16 (×2): 25 ug via INTRAVENOUS
  Administered 2014-11-16: 50 ug via INTRAVENOUS
  Administered 2014-11-16 (×2): 25 ug via INTRAVENOUS
  Administered 2014-11-16 (×2): 50 ug via INTRAVENOUS

## 2014-11-16 MED ORDER — FENTANYL CITRATE 0.05 MG/ML IJ SOLN
INTRAMUSCULAR | Status: AC
  Filled 2014-11-16: qty 5

## 2014-11-16 MED ORDER — ONDANSETRON HCL 4 MG/2ML IJ SOLN: 4.0000 mg | Freq: Four times a day (QID) | INTRAMUSCULAR | Status: DC | PRN

## 2014-11-16 MED ORDER — MEPERIDINE HCL 50 MG/ML IJ SOLN: 6.2500 mg | INTRAMUSCULAR | Status: DC | PRN

## 2014-11-16 MED ORDER — FENTANYL CITRATE 0.05 MG/ML IJ SOLN
25.0000 ug | INTRAMUSCULAR | Status: DC | PRN
  Administered 2014-11-16: 25 ug via INTRAVENOUS
  Administered 2014-11-16: 50 ug via INTRAVENOUS

## 2014-11-16 MED ORDER — ONDANSETRON HCL 4 MG/2ML IJ SOLN
INTRAMUSCULAR | Status: DC | PRN
  Administered 2014-11-16: 4 mg via INTRAVENOUS

## 2014-11-16 MED ORDER — ONDANSETRON HCL 4 MG/2ML IJ SOLN
INTRAMUSCULAR | Status: AC
  Filled 2014-11-16: qty 2

## 2014-11-16 MED ORDER — DEXAMETHASONE SODIUM PHOSPHATE 10 MG/ML IJ SOLN
INTRAMUSCULAR | Status: DC | PRN
  Administered 2014-11-16: 10 mg via INTRAVENOUS

## 2014-11-16 MED ORDER — CHLORHEXIDINE GLUCONATE 4 % EX LIQD: 1.0000 "application " | Freq: Once | CUTANEOUS | Status: DC

## 2014-11-16 MED ORDER — VANCOMYCIN HCL IN DEXTROSE 1-5 GM/200ML-% IV SOLN
1000.0000 mg | INTRAVENOUS | Status: AC
  Administered 2014-11-16: 1000 mg via INTRAVENOUS

## 2014-11-16 MED ORDER — CEFAZOLIN SODIUM-DEXTROSE 2-3 GM-% IV SOLR: 2.0000 g | Freq: Three times a day (TID) | INTRAVENOUS | Status: DC

## 2014-11-16 MED ORDER — FENTANYL CITRATE 0.05 MG/ML IJ SOLN
INTRAMUSCULAR | Status: AC
  Filled 2014-11-16: qty 2

## 2014-11-16 MED ORDER — MIDAZOLAM HCL 5 MG/5ML IJ SOLN
INTRAMUSCULAR | Status: DC | PRN
  Administered 2014-11-16: 2 mg via INTRAVENOUS

## 2014-11-16 SURGICAL SUPPLY — 41 items
BINDER ABDOMINAL 12 ML 46-62 (SOFTGOODS) IMPLANT
BLADE HEX COATED 2.75 (ELECTRODE) ×2 IMPLANT
DRAIN CHANNEL RND F F (WOUND CARE) ×2 IMPLANT
DRAPE LAPAROSCOPIC ABDOMINAL (DRAPES) ×2 IMPLANT
DRAPE POUCH INSTRU U-SHP 10X18 (DRAPES) ×2 IMPLANT
ELECT REM PT RETURN 9FT ADLT (ELECTROSURGICAL) ×2
ELECTRODE REM PT RTRN 9FT ADLT (ELECTROSURGICAL) ×1 IMPLANT
EVACUATOR SILICONE 100CC (DRAIN) ×2 IMPLANT
GAUZE PACKING IODOFORM 2 (PACKING) ×2 IMPLANT
GAUZE SPONGE 4X4 12PLY STRL (GAUZE/BANDAGES/DRESSINGS) ×2 IMPLANT
GLOVE BIOGEL PI IND STRL 7.0 (GLOVE) ×3 IMPLANT
GLOVE BIOGEL PI INDICATOR 7.0 (GLOVE) ×3
GLOVE EUDERMIC 7 POWDERFREE (GLOVE) ×2 IMPLANT
GOWN STRL REUS W/TWL LRG LVL3 (GOWN DISPOSABLE) ×2 IMPLANT
GOWN STRL REUS W/TWL XL LVL3 (GOWN DISPOSABLE) ×4 IMPLANT
KIT BASIN OR (CUSTOM PROCEDURE TRAY) ×2 IMPLANT
LIQUID BAND (GAUZE/BANDAGES/DRESSINGS) ×2 IMPLANT
MATRIX SURGICAL PSMX 7X10CM (Tissue) ×2 IMPLANT
NS IRRIG 1000ML POUR BTL (IV SOLUTION) ×2 IMPLANT
PACK GENERAL/GYN (CUSTOM PROCEDURE TRAY) ×2 IMPLANT
STAPLER VISISTAT 35W (STAPLE) ×2 IMPLANT
SUT ETHILON 3 0 PS 1 (SUTURE) ×2 IMPLANT
SUT MNCRL AB 4-0 PS2 18 (SUTURE) ×2 IMPLANT
SUT NOVA NAB DX-16 0-1 5-0 T12 (SUTURE) IMPLANT
SUT NOVA NAB GS-21 1 T12 (SUTURE) ×4 IMPLANT
SUT PDS AB 1 CTX 36 (SUTURE) IMPLANT
SUT PROLENE 0 CT 1 CR/8 (SUTURE) IMPLANT
SUT PROLENE 2 0 CT2 30 (SUTURE) ×8 IMPLANT
SUT SILK 2 0 (SUTURE)
SUT SILK 2 0 30  PSL (SUTURE)
SUT SILK 2 0 30 PSL (SUTURE) IMPLANT
SUT SILK 2-0 18XBRD TIE 12 (SUTURE) IMPLANT
SUT VIC AB 2-0 CT1 27 (SUTURE)
SUT VIC AB 2-0 CT1 27XBRD (SUTURE) IMPLANT
SUT VIC AB 2-0 CTX 36 (SUTURE) IMPLANT
SUT VIC AB 3-0 SH 18 (SUTURE) ×2 IMPLANT
SUT VIC AB 3-0 SH 27 (SUTURE) ×1
SUT VIC AB 3-0 SH 27XBRD (SUTURE) ×1 IMPLANT
TAPE CLOTH SURG 4X10 WHT LF (GAUZE/BANDAGES/DRESSINGS) ×2 IMPLANT
TOWEL OR 17X26 10 PK STRL BLUE (TOWEL DISPOSABLE) ×2 IMPLANT
TRAY FOLEY CATH 14FRSI W/METER (CATHETERS) IMPLANT

## 2014-11-16 NOTE — Anesthesia Procedure Notes (Signed)
Procedure Name: LMA Insertion Date/Time: 11/16/2014 9:33 AM Performed by: Dione Booze Pre-anesthesia Checklist: Patient identified, Emergency Drugs available and Suction available Patient Re-evaluated:Patient Re-evaluated prior to inductionOxygen Delivery Method: Circle system utilized Preoxygenation: Pre-oxygenation with 100% oxygen Intubation Type: IV induction LMA: LMA inserted LMA Size: 4.0 Number of attempts: 1 Placement Confirmation: breath sounds checked- equal and bilateral and positive ETCO2 Dental Injury: Teeth and Oropharynx as per pre-operative assessment

## 2014-11-16 NOTE — Progress Notes (Signed)
Pt arrived unit from PACU post surgery. Pt is alert and oriented, able to verbalize needs. Will continue with current plan of care.

## 2014-11-16 NOTE — Op Note (Signed)
Patient Name:           Sheila Love   Date of Surgery:        11/16/2014  Pre op Diagnosis:      Deep fibromatosis of the abdominal wall  Post op Diagnosis:    Same  Procedure: Reexcision of skin, subcutaneous tissue, fascia, and muscle of abdominal wall, 10 x 7 cm area, abdominal wall closure with ACell  surgical matrix reinforcement (10 cm.  X  7 cm. Mesh)  Surgeon:                     Edsel Petrin. Dalbert Batman, M.D., FACS  Assistant:                      Harvie Bridge  Operative Indications:   The patient is a 34 year old female who presents with a complaint of deep fibromatosis of abdominal wall. This patient underwent excision of a 2.5 cm abdominal wall mass in the right lower quadrant of the abdominal wall immediately behind the anterior rectus sheath on 09/28/14. 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 The patient has had no problems with wound healing I have explained the risk of recurrence with her. I discussed options of wider resection for wider margins, radiation therapy and observation. Surgery is recommended. Both she and her husband have done lots of research    and would like to have this area reexcised. 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 She has recently had a colonoscopy which is normal. 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 They would like to go ahead with the surgery.   Operative Findings:       There was a healing transverse wound in the right lower quadrant of the abdominal wall, about 3 cm above her Pfannenstiel incision from her previous C-section. There is no evidence of any infection or tumor but there was moderate scar tissue, as expected. I widely excised all layers of the abdominal wall but left the thin peritoneum intact. The old scar was completely excised. The skin and subcutaneous tissue was undermined superiorly and inferiorly  extensively to allow primary closure of the fascia but it was under some tension so I reinforced it with biologic mesh.  Procedure in Detail:          Following the induction of general endotracheal anesthesia the patient's abdomen and groins were prepped and draped in a sterile fashion. Antibiotics were given. Surgical timeout was performed. 0.5% Marcaine with epinephrine was used as local infiltration anesthetic. I made a transverse elliptical incision taking about 8 mm skin above and below the old scar. I took the dissection down through subcutaneous tissue and widely around the old palpable scar. I divided the fascia sharply I divided the muscles sharply all the way down to the peritoneum. The muscle was very soft and felt normal. I felt that I had gotten more than wide enough margins around this area. The specimen was removed and marked with silk sutures in 3 different orientations to orient the pathologist for margins. Hemostasis was excellent and achieved with electrocautery. A couple of blood vessels were tied off with 3-0 Vicryl ties. I undermined the skin and subcutaneous tissue superiorly and inferiorly.   I found that I could close the fascia primarily transversely with interrupted sutures of #1 Novafil. I brought a 10 cm x 7 cm piece  of ACell biologic mesh to the field. This was soaked. It was oriented. It was sutured in place with multiple interrupted sutures of 2-0 Prolene. This overlapped the fascial closure about 2 or 3 cm in most directions. I fenestrated the mesh to allow drainage. I felt that we needed a drain because of the space that had been created by undermining. A 19 [place] Blake drain was placed in the wound and brought out through a separate stab incision in the right lateral abdomen, sutured  to the skin and connected to suction bulbs.   The subcutaneous tissues were  closed with 3-0 Vicryl and skin closed with a running subcutaneous  4-0 Monocryl and Dermabond. After the Dermabond  dried clean bandages were placed and patient taken to PACU in stable condition. EBL 25 mL or less. Counts correct. Complications none.     Edsel Petrin. Dalbert Batman, M.D., FACS General and Minimally Invasive Surgery Breast and Colorectal Surgery  11/16/2014 10:44 AM

## 2014-11-16 NOTE — Interval H&P Note (Signed)
History and Physical Interval Note:  11/16/2014 8:43 AM  Sheila Love  has presented today for surgery, with the diagnosis of fibromatosis of abdominal wall  The various methods of treatment have been discussed with the patient and family. After consideration of risks, benefits and other options for treatment, the patient has consented to  Procedure(s): RE-EXCISION FIBROMATOSIS OF ABDOMINAL WALL (N/A) INSERTION OF MESH (N/A) as a surgical intervention .  The patient's history has been reviewed, patient examined today, no change in status, stable for surgery.  I have reviewed the patient's chart and labs.  Questions were answered to the patient's satisfaction.     [name]

## 2014-11-16 NOTE — Progress Notes (Signed)
ANTIBIOTIC CONSULT NOTE - INITIAL  Pharmacy Consult for Vancomycin Indication: Surgical Prophylaxis x 24 hours post-op  Allergies  Allergen Reactions  . Amoxicillin Hives    Patient Measurements: Height: 5\' 7"  (170.2 cm) Weight: 136 lb 4 oz (61.803 kg) IBW/kg (Calculated) : 61.6   Vital Signs: Temp: 98.2 F (36.8 C) (02/10 1229) Temp Source: Oral (02/10 0814) BP: 110/68 mmHg (02/10 1229) Pulse Rate: 55 (02/10 1229) Intake/Output from previous day:   Intake/Output from this shift: Total I/O In: 700 [I.V.:700] Out: 25 [Blood:25]  Labs:  Recent Labs  11/16/14 0840 11/16/14 1424  WBC 4.6  --   HGB 12.0  --   PLT 219  --   CREATININE  --  0.72   Estimated Creatinine Clearance: 98.2 mL/min (by C-G formula based on Cr of 0.72). No results for input(s): VANCOTROUGH, VANCOPEAK, VANCORANDOM, GENTTROUGH, GENTPEAK, GENTRANDOM, TOBRATROUGH, TOBRAPEAK, TOBRARND, AMIKACINPEAK, AMIKACINTROU, AMIKACIN in the last 72 hours.   Microbiology: No results found for this or any previous visit (from the past 720 hour(s)).  Medical History: Past Medical History  Diagnosis Date  . Abdominal wall mass 09-2014  . Complication of anesthesia     2014 Took long time for bladder to wake up after 2nd c Section surgery 2014    Assessment: 33 y/oF s/p reexcision of deep fibramatosis of abdominal wall and insertion of mesh. Patient has documented amoxicillin allergy.  Pharmacy requested to dose Vancomycin for surgical prophylaxis for 24 hours post-operative period.     Patient received 1 gram of IV Vancomycin pre-procedure at 0940.  SCr 0.72, CrCl ~ 98 mL/min  Goal of Therapy:  Appropriate antibiotic dosing for renal function and indication Prevention of infection  Plan:   Give Vancomycin 1 gram IV x 1 dose at 2200 tonight.  This will cover patient through the 24 hour post-operative period.   Thank you for the consult.  Lindell Spar, PharmD, BCPS Pager: [phone] 11/16/2014 3:46  PM

## 2014-11-16 NOTE — Anesthesia Postprocedure Evaluation (Signed)
Anesthesia Post Note  Patient: Sheila Love  Procedure(s) Performed: Procedure(s) (LRB): RE-EXCISION FIBROMATOSIS OF ABDOMINAL WALL (N/A) INSERTION OF MESH (N/A)  Anesthesia type: general  Patient location: PACU  Post pain: Pain level controlled  Post assessment: Patient's Cardiovascular Status Stable  Last Vitals:  Filed Vitals:   11/16/14 1229  BP: 110/68  Pulse: 55  Temp: 36.8 C  Resp: 15    Post vital signs: Reviewed and stable  Level of consciousness: sedated  Complications: No apparent anesthesia complications

## 2014-11-16 NOTE — Anesthesia Preprocedure Evaluation (Signed)
Anesthesia Evaluation  Patient identified by MRN, date of birth, ID band Patient awake    Reviewed: Allergy & Precautions, H&P , NPO status , Patient's Chart, lab work & pertinent test results  History of Anesthesia Complications Negative for: history of anesthetic complications  Airway Mallampati: II  TM Distance: >3 FB Neck ROM: Full    Dental no notable dental hx. (+) Dental Advisory Given   Pulmonary neg pulmonary ROS,  breath sounds clear to auscultation  Pulmonary exam normal       Cardiovascular Exercise Tolerance: Good negative cardio ROS  Rhythm:Regular Rate:Normal     Neuro/Psych negative neurological ROS  negative psych ROS   GI/Hepatic negative GI ROS, Neg liver ROS,   Endo/Other  negative endocrine ROS  Renal/GU negative Renal ROS  negative genitourinary   Musculoskeletal negative musculoskeletal ROS (+)   Abdominal   Peds negative pediatric ROS (+)  Hematology negative hematology ROS (+)   Anesthesia Other Findings   Reproductive/Obstetrics negative OB ROS                             Anesthesia Physical  Anesthesia Plan  ASA: I  Anesthesia Plan: General   Post-op Pain Management:    Induction: Intravenous  Airway Management Planned: LMA  Additional Equipment:   Intra-op Plan:   Post-operative Plan: Extubation in OR  Informed Consent: I have reviewed the patients History and Physical, chart, labs and discussed the procedure including the risks, benefits and alternatives for the proposed anesthesia with the patient or authorized representative who has indicated his/her understanding and acceptance.   Dental advisory given  Plan Discussed with: CRNA  Anesthesia Plan Comments:         Anesthesia Quick Evaluation

## 2014-11-16 NOTE — Transfer of Care (Signed)
Immediate Anesthesia Transfer of Care Note  Patient: Sheila Love  Procedure(s) Performed: Procedure(s): RE-EXCISION FIBROMATOSIS OF ABDOMINAL WALL (N/A) INSERTION OF MESH (N/A)  Patient Location: PACU  Anesthesia Type:General  Level of Consciousness: awake, alert , oriented and patient cooperative  Airway & Oxygen Therapy: Patient Spontanous Breathing and Patient connected to face mask oxygen  Post-op Assessment: Report given to RN and Post -op Vital signs reviewed and stable  Post vital signs: Reviewed and stable  Last Vitals:  Filed Vitals:   11/16/14 0814  BP: 98/58  Pulse: 72  Temp: 36.3 C  Resp: 18    Complications: No apparent anesthesia complications

## 2014-11-17 DIAGNOSIS — D481 Neoplasm of uncertain behavior of connective and other soft tissue: Secondary | ICD-10-CM | POA: Diagnosis not present

## 2014-11-17 MED ORDER — OXYCODONE-ACETAMINOPHEN 7.5-325 MG PO TABS: 1.0000 | ORAL_TABLET | ORAL | Status: DC | PRN

## 2014-11-17 NOTE — Discharge Instructions (Signed)
-  see above 

## 2014-11-17 NOTE — Discharge Summary (Signed)
Patient ID: Sheila Love [id number] 33 y.o. 1981/02/17  Admit date: 11/16/2014  Discharge date and time: 11/17/2014  Admitting Physician: [name]  Discharge Physician: [name]  Admission Diagnoses: . deep fibromatosis of abdominal wall  Discharge Diagnoses: Deep fibromatosis of abdominal wall, status post excisional biopsy with focal positive margin  Operations: Procedure(s): RE-EXCISION FIBROMATOSIS OF ABDOMINAL WALL INSERTION OF MESH  Admission Condition: good  Discharged Condition: good  Indication for Admission: The patient is a 34 year old female who presents with a complaint of deep fibromatosis of abdominal wall. This patient underwent excision of a 2.5 cm abdominal wall mass in the right lower quadrant of the abdominal wall immediately behind the anterior rectus sheath on 09/28/14. 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 The patient has had no problems with wound healing I have explained the risk of recurrence with her. I discussed options of wider resection for wider margins, radiation therapy and observation. Surgery is recommended. Both she and her husband have done lots of research and would like to have this area reexcised. 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 She has recently had a colonoscopy which is normal. 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 They would like to go ahead with the surgery.  Hospital Course: On the day of admission the patient was taken to the operating room and underwent reexcision of the abdominal wall right lower quadrant. We took full-thickness all the way down to the peritoneum tried to get a 1 cm margin or greater in all directions. We had to undermine the subcutaneous tissue to allow closure of the fascia and we reinforced the closure with a cell biologic mesh and left a drain. Overnight she had  some trouble voiding but she has had in the past but that is improving. She is sore but pain control is adequate. She has no nausea and is tolerating liquid diet. She's able to get out of bed by herself. It was her desire to go home today. On examination her abdomen is flat and soft with active bowel sounds. The bandage in the right lower quadrant is clean without any drainage. JP drain has serosanguineous fluid. Vital signs are stable. Diet and activities were discussed. She knows that she can't run or performed  sports or do any heavy lifting for about 6 weeks. She is given a prescription for Percocet for pain. Drain care instructions were given. Diet and activities were discussed. She will return to see me in the office in one week.  Consults: None  Significant Diagnostic Studies: Surgical pathology, pending  Treatments: surgery: Wide local excision deep fibromatosis abdominal wall, right lower quadrant  Disposition: Home  Patient Instructions:    Medication List    TAKE these medications        HYDROcodone-acetaminophen 5-325 MG per tablet  Commonly known as:  NORCO  Take 1-2 tablets by mouth every 6 (six) hours as needed.     oxyCODONE-acetaminophen 7.5-325 MG per tablet  Commonly known as:  PERCOCET  Take 1 tablet by mouth every 4 (four) hours as needed for pain.        Activity: No sports or heavy lifting for 6 weeks. No driving for 1 or 2 weeks. Diet: low fat, low cholesterol diet Wound Care: Keep wound clean and dry. Sponge bathe. Keep a record of the drainage. Change dry gauze bandage if necessary.  Follow-up:  With Dr. Dalbert Batman in  1 week.  Signed: Edsel Petrin. Dalbert Batman, M.D., FACS General and minimally invasive surgery Breast and Colorectal Surgery  11/17/2014, 6:18 AM

## 2014-11-17 NOTE — Progress Notes (Signed)
Went over d/c instructions and meds with patient. Also, taught patient how to drain & record output from JP drain. Pt verbalized understanding and had no additional questions or concerns.

## 2014-11-18 ENCOUNTER — Encounter (HOSPITAL_COMMUNITY): Payer: Self-pay | Admitting: General Surgery

## 2014-11-26 ENCOUNTER — Telehealth (INDEPENDENT_AMBULATORY_CARE_PROVIDER_SITE_OTHER): Payer: Self-pay | Admitting: General Surgery

## 2014-11-26 NOTE — Telephone Encounter (Signed)
Pt complains of drainage around JP site and minimal drainage in bulb.  She has tried stripping the tube but continues to have intermittent leakage of clear fluid.  Reassurance was given.  She will call on Mon if she continues to have this problem over the weekend.

## 2014-12-06 ENCOUNTER — Encounter: Payer: Self-pay | Admitting: Radiation Oncology

## 2014-12-06 ENCOUNTER — Ambulatory Visit
Admission: RE | Admit: 2014-12-06 | Discharge: 2014-12-06 | Disposition: A | Discharge status: Home / Self Care | Payer: 59 | Source: Ambulatory Visit | Attending: Radiation Oncology | Admitting: Radiation Oncology

## 2014-12-06 VITALS — BP 110/62 | HR 74 | Temp 98.0°F | Ht 67.0 in | Wt 140.7 lb

## 2014-12-06 DIAGNOSIS — L259 Unspecified contact dermatitis, unspecified cause: Secondary | ICD-10-CM | POA: Insufficient documentation

## 2014-12-06 DIAGNOSIS — D485 Neoplasm of uncertain behavior of skin: Secondary | ICD-10-CM | POA: Diagnosis not present

## 2014-12-06 DIAGNOSIS — Z510 Encounter for antineoplastic radiation therapy: Secondary | ICD-10-CM | POA: Diagnosis present

## 2014-12-06 DIAGNOSIS — L599 Disorder of the skin and subcutaneous tissue related to radiation, unspecified: Secondary | ICD-10-CM | POA: Diagnosis not present

## 2014-12-06 DIAGNOSIS — M729 Fibroblastic disorder, unspecified: Secondary | ICD-10-CM

## 2014-12-06 DIAGNOSIS — D481 Neoplasm of uncertain behavior of connective and other soft tissue: Secondary | ICD-10-CM

## 2014-12-06 NOTE — Addendum Note (Signed)
Encounter addended by: Deirdre Evener, RN on: 12/06/2014  5:15 PM<BR>     Documentation filed: Charges VN

## 2014-12-06 NOTE — Progress Notes (Signed)
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 female who is seen today for discussion of postoperative radiation therapy in the management of her aggressive fibromatosis/desmoid tumor.  She states that she may have first noted a soft tissue mass along the right lower quadrant last summer.  On 07/24/2014 she noted a 1 inch mass along the right lower quadrant when staying with her family in [place].  She apparently had an ultrasound and was told to have follow-up in [place] where she was moving to.  She was seen at [facility] family practice and then referred to Dr. Dalbert Batman for further evaluation.  Dr. Dalbert Batman noted a 2 x 1.5 cm firm mass along the right lower quadrant which seem to be fixed to the fascia.  Ultrasound on 09/09/2014 showed a solid 1.9 x 1.3 x 2.6 cm subcutaneous mass along the right lower quadrant.  Dr. Dalbert Batman excised the mass on 09/28/2014 and she was found to have a 2.8 cm deep fibromatosis/desmoid-type tumor focally involving the cauterized margin.  She was taken back to the operating room on 11/16/2014 with a specimen measuring 6.3 x 5.8 x 2.5 cm containing muscle with residual fibromatosis approaching the superior margin (less than 1 mm) but not truly at the margin.  Postoperative changes were seen from previous surgery.  Postoperatively, she reports abdominal/muscular like discomfort which is slowly improving.  She is otherwise doing well.  She is seen here for discussion of possible postoperative radiation therapy.  PREVIOUS RADIATION THERAPY: No   PAST MEDICAL HISTORY:  has a past medical history of  Abdominal wall mass ([id number]) and Complication of anesthesia.     PAST SURGICAL HISTORY:  Past Surgical History  Procedure Laterality Date  . Laparotomy  2011  . Cesarean section  2013  . Cesarean section  2014  . Resection of abdominal mass Right 09/28/2014    Procedure: EXCISE 2CM  ABDOMINAL WALL MASS;  Surgeon: Fanny Skates, MD;  Location: [facility];  Service: General;  Laterality: Right;  . Abdominal wall defect repair N/A 11/16/2014    Procedure: RE-EXCISION FIBROMATOSIS OF ABDOMINAL WALL;  Surgeon: Fanny Skates, MD;  Location: [facility];  Service: General;  Laterality: N/A;  . Insertion of mesh N/A 11/16/2014    Procedure: INSERTION OF MESH;  Surgeon: Fanny Skates, MD;  Location: [facility];  Service: General;  Laterality: N/A;     FAMILY HISTORY:  Her father had non-Hodgkin's lymphoma is currently alive at age 63 and is in remission.  Her mother is alive at age 73.  No family history of soft tissue malignancy.   SOCIAL HISTORY:  reports that she has never smoked. She has never used smokeless tobacco. She reports that she does not drink alcohol or use illicit drugs.  Married, 2 children, daughters ages 54 and 50 months.  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  She plans to travel with him.   ALLERGIES: Amoxicillin   MEDICATIONS:  No current outpatient prescriptions on file.   No current facility-administered  medications for this encounter.     REVIEW OF SYSTEMS:  Pertinent items are noted in HPI.    PHYSICAL EXAM:  height is 5\' 7"  (1.702 m) and weight is 140 lb 11.2 oz (63.821 kg). Her temperature is 98 F (36.7 C). Her blood pressure is 110/62 and her pulse is 74.   Alert and oriented 34 year old female appearing her stated age.  Examination confined to the abdomen.  There is a horizontal right lower quadrant wound which is healing well.  There is no palpable evidence for persistent disease.  There  is no inguinal adenopathy.   LABORATORY DATA:  Lab Results  Component Value Date   WBC 4.6 11/16/2014   HGB 12.0 11/16/2014   HCT 35.8* 11/16/2014   MCV 89.7 11/16/2014   PLT 219 11/16/2014   No results found for: NA, K, CL, CO2 No results found for: ALT, AST, GGT, ALKPHOS, BILITOT    IMPRESSION: Aggressive fibromatosis/desmoid tumor arising from the abdominal wall.  These are obviously rare tumors that can be seen in postpartum woman.  However, this lesion appears to have developed almost one year postpartum.  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  Recent colonoscopy was negative.  Clinical factors which predict for local failure include inadequate margins and previous recurrent disease.  I performed a radiation oncology literature search and also an Up To Date search/review and there is significant controversy regarding the appropriate management in this setting.  Surgery is obviously the treatment of choice , and radiation therapy is often used for positive resection margins, recurrent disease, or for patients with unresectable disease.  Reports from the [facility] and also [facility] question the efficacy of radiation therapy in patients with microscopically involved margins.  Many clinicians conclude that deferring radiation therapy is an acceptable option for patients with microscopically positive margins as long as local progression would not risk significant morbidity.  Dr. Dalbert Batman informed the patient that there may be significant morbidity should he have to perform another wide resection in the event that she develops recurrent disease.  There are no specific NCCN guidelines to follow.  One has to understand that there would be a low risk for development of a second malignancy many years down the road, primarily in younger patients.  I gave the patient a copy of the Up  To Date review, and she will visit with Dr. Dalbert Batman next week to once again go over her options.  I am willing to offer her radiation therapy based on her close if not involved surgical margin.  I would think that she is at risk for a local recurrence.  From a technical standpoint, we would not need to be concerned about significant toxicity and she would receive electron beam radiation therapy alone or mixed electron/photon irradiation.  She would be at a small risk for development of a radiation-related malignancy.   PLAN: As discussed above.  I will discuss her management with Dr. Dalbert Batman.  I spent 60  minutes face to face with the patient and more than 50% of that time was spent in counseling and/or coordination of care.

## 2014-12-06 NOTE — Progress Notes (Signed)
GI Location: Deep Fibromatosis of Abdominal Wall, Right Lower Quadrant  Sheila Love presented The patient is a 34 year old female who presents with a complaint of deep fibromatosis of abdominal wall. This patient underwent excision of a 2.5 cm abdominal wall mass in the right lower quadrant of the abdominal wall immediately behind the anterior rectus sheath on 09/28/14. 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   Biopsy of Abdominal Soft Tissue Mass-Wide local excision deep fibromatosis abdominal wall, right lower quadrant  11/16/14 Diagnosis Soft tissue mass, simple excision - DEEP FIBROMATOSIS (DESMOID)   Past/Anticipated interventions by surgeon, if any: Dr. Fanny Skates: Excision of the Abdominal Mass  Past/Anticipated interventions by medical oncology, if any: None  Weight changes, if any: No   Bowel/Bladder complaints, if any: None  Nausea / Vomiting, if any: No  Pain issues, if any:  No  SAFETY ISSUES:  Prior radiation? No  Pacemaker/ICD? No  Possible current pregnancy? No  Is the patient on methotrexate?No  Current Complaints / other details:

## 2014-12-15 ENCOUNTER — Encounter: Payer: Self-pay | Admitting: Radiation Oncology

## 2014-12-20 ENCOUNTER — Other Ambulatory Visit: Payer: Self-pay | Admitting: Dermatology

## 2014-12-21 ENCOUNTER — Ambulatory Visit
Admission: RE | Admit: 2014-12-21 | Discharge: 2014-12-21 | Disposition: A | Discharge status: Home / Self Care | Payer: 59 | Source: Ambulatory Visit | Attending: Radiation Oncology | Admitting: Radiation Oncology

## 2014-12-21 ENCOUNTER — Encounter: Payer: Self-pay | Admitting: Radiation Oncology

## 2014-12-21 VITALS — BP 119/69 | HR 78 | Temp 97.8°F | Ht 67.0 in | Wt 138.9 lb

## 2014-12-21 DIAGNOSIS — Z510 Encounter for antineoplastic radiation therapy: Secondary | ICD-10-CM | POA: Diagnosis not present

## 2014-12-21 DIAGNOSIS — M729 Fibroblastic disorder, unspecified: Secondary | ICD-10-CM

## 2014-12-21 DIAGNOSIS — D483 Neoplasm of uncertain behavior of retroperitoneum: Secondary | ICD-10-CM

## 2014-12-21 DIAGNOSIS — D484 Neoplasm of uncertain behavior of peritoneum: Principal | ICD-10-CM

## 2014-12-21 NOTE — Addendum Note (Signed)
Encounter addended by: Benn Moulder, RN on: 12/21/2014  5:30 PM<BR>     Documentation filed: Charges VN

## 2014-12-21 NOTE — Progress Notes (Signed)
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 is a most pleasant 34 year old female who is seen today for discussion of postoperative radiation therapy in the management of her aggressive fibromatosis involving the right lower abdominal wall.  I saw the patient in consultation on 12/06/2014.    She states that she may have first noted a soft tissue mass along the right lower quadrant last summer. On 07/24/2014 she noted a 1 inch mass along the right lower quadrant when staying with her family in [place]. She apparently had an ultrasound and was told to have follow-up in [place] where she was moving to. She was seen at [facility] family practice and then referred to Dr. Dalbert Batman for further evaluation. Dr. Dalbert Batman noted a 2 x 1.5 cm firm mass along the right lower quadrant which seem to be fixed to the fascia. Ultrasound on 09/09/2014 showed a solid 1.9 x 1.3 x 2.6 cm subcutaneous mass along the right lower quadrant. Dr. Dalbert Batman excised the mass on 09/28/2014 and she was found to have a 2.8 cm deep fibromatosis/desmoid-type tumor focally involving the cauterized margin. She was taken back to the operating room on 11/16/2014 with a specimen measuring 6.3 x 5.8 x 2.5 cm containing muscle with residual fibromatosis approaching the superior margin (less than 1 mm) but not truly at the margin. Postoperative changes were seen from previous surgery. Postoperatively, she reported abdominal/muscular like discomfort which is slowly improving.More recently she developed what appears to be a delayed contact dermatitis involving her lower abdomen for which she has been seen by Dr. Allyson Sabal who prescribed corticosteroid cream initially, but now is taking by mouth corticosteroids.  The rash is pruritic.   Physical examination: Alert and oriented. Filed Vitals:   12/21/14  1026  BP: 119/69  Pulse: 78  Temp: 97.8 F (36.6 C)   She has what appears to be a contact dermatitis along her lower abdomen, right greater than left.  Her wound appears to be well-healed.  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   The patient and her husband were able to review the Up To Date information I gave them at the time of consultation.  I spoke with Dr. Dalbert Batman and reviewed her case with him after I saw her in consultation.  He feels that in the event of a local recurrence she would face morbid surgery, and we should do everything within reason to reduce her risk for recurrence.  Therefore, I feel that it is perfectly reasonable to proceed with postoperative radiation therapy.  I anticipate 5 weeks of electron beam radiation therapy.  I discussed the potential acute and late toxicities of radiation therapy which should be generally well tolerated.  She is currently menstruating and denies having a pregnancy.  I cautioned her about getting pregnant during radiation therapy.  I doubt that radiation therapy (electron Beam) will affect ovarian function, and we will estimate the dose to be delivered to her ovaries.  We also discussed the small risk for a radiation-induced malignancy.  Consent is signed today.  Plan: We will proceed with simulation/treatment planning early next week.  We may not begin her radiation therapy until early April, once her rash has dissipated.  25 minutes was spent face-to-face with the patient, primarily counseling patient and coordinating her care.

## 2014-12-21 NOTE — Progress Notes (Addendum)
Sheila Love reports that she had surgery 3 weeks ago and since has developed a rash in left abdomen, right abdomen and right thigh.  Now since using Clobetasol x 2 doses,  she is noticing improvement of the rash.  She denies any pain today.

## 2014-12-27 ENCOUNTER — Ambulatory Visit
Admission: RE | Admit: 2014-12-27 | Discharge: 2014-12-27 | Disposition: A | Discharge status: Home / Self Care | Payer: 59 | Source: Ambulatory Visit | Attending: Radiation Oncology | Admitting: Radiation Oncology

## 2014-12-27 DIAGNOSIS — Z510 Encounter for antineoplastic radiation therapy: Secondary | ICD-10-CM | POA: Diagnosis not present

## 2014-12-27 DIAGNOSIS — M729 Fibroblastic disorder, unspecified: Secondary | ICD-10-CM

## 2014-12-27 NOTE — Progress Notes (Signed)
Complex simulation/treatment planning note: The patient was taken to the CT simulator.  A Vac lock immobilization device was constructed.  Her right lower quadrant scar was marked with a radiopaque wire.  A superior BB was placed along her drain site, and a separate BB was placed in the location with the patient first felt a subcutaneous mass.  This was above her horizontal surgical scar.  I marked a preliminary electron beam field on her skin and this was marked with a radiopaque wire.  She was then scanned.  I contoured her tumor bed, guided by the wires.  Dosimetry will contoured her bowel and ovaries.  I prescribing 5040 cGy in 28 sessions utilizing 9 MEV electrons.  I estimated that the  maximum tumor bed depth from the skin surface was approximately 2.4 cm, and felt that 9 MEV electrons would be satisfactory with prescribed at 100% isodose curve.  She is now ready for a treatment planning.

## 2014-12-29 ENCOUNTER — Encounter: Payer: Self-pay | Admitting: Radiation Oncology

## 2014-12-29 DIAGNOSIS — Z510 Encounter for antineoplastic radiation therapy: Secondary | ICD-10-CM | POA: Diagnosis not present

## 2014-12-29 NOTE — Progress Notes (Signed)
Complex simulation/treatment planning note: The patient completed her treatment planning for her abdominal wall electron beam radiation therapy.  She was set up en face with 9 MEV electrons.  One custom block is constructed to conform the field.  A special port plan is reviewed and accepted.  I'm prescribing 5040 cGy in 28 sessions with [facility] calculation.

## 2015-01-09 ENCOUNTER — Ambulatory Visit
Admission: RE | Admit: 2015-01-09 | Discharge: 2015-01-09 | Disposition: A | Discharge status: Home / Self Care | Payer: 59 | Source: Ambulatory Visit | Attending: Radiation Oncology | Admitting: Radiation Oncology

## 2015-01-09 DIAGNOSIS — M729 Fibroblastic disorder, unspecified: Secondary | ICD-10-CM

## 2015-01-09 DIAGNOSIS — Z510 Encounter for antineoplastic radiation therapy: Secondary | ICD-10-CM | POA: Diagnosis not present

## 2015-01-09 NOTE — Progress Notes (Signed)
Weekly Management Note:  Site: Right lower abdomen Current Dose:  180  cGy Projected Dose: 5040  cGy  Narrative: The patient is seen today for routine under treatment assessment. CBCT/MVCT images/port films were reviewed. The chart was reviewed.   She is without complaints today.  Her setup is excellent.  Physical Examination: There were no vitals filed for this visit..  Weight:  .  No skin changes.  Impression: Tolerating radiation therapy well.  Plan: Continue radiation therapy as planned.

## 2015-01-10 ENCOUNTER — Ambulatory Visit
Admission: RE | Admit: 2015-01-10 | Discharge: 2015-01-10 | Disposition: A | Discharge status: Home / Self Care | Payer: 59 | Source: Ambulatory Visit | Attending: Radiation Oncology | Admitting: Radiation Oncology

## 2015-01-10 DIAGNOSIS — Z510 Encounter for antineoplastic radiation therapy: Secondary | ICD-10-CM | POA: Diagnosis not present

## 2015-01-11 ENCOUNTER — Telehealth: Payer: Self-pay | Admitting: Radiation Oncology

## 2015-01-11 ENCOUNTER — Ambulatory Visit: Admission: RE | Admit: 2015-01-11 | Payer: 59 | Source: Ambulatory Visit

## 2015-01-11 ENCOUNTER — Ambulatory Visit
Admission: RE | Admit: 2015-01-11 | Discharge: 2015-01-11 | Disposition: A | Discharge status: Home / Self Care | Payer: 59 | Source: Ambulatory Visit | Attending: Radiation Oncology | Admitting: Radiation Oncology

## 2015-01-11 DIAGNOSIS — Z510 Encounter for antineoplastic radiation therapy: Secondary | ICD-10-CM | POA: Diagnosis not present

## 2015-01-11 NOTE — Telephone Encounter (Signed)
Patient's spouse LVM w/[facility] and had questions about pre-certification.  Called back to discuss, lm 10:50am 01/11/15

## 2015-01-12 ENCOUNTER — Ambulatory Visit
Admission: RE | Admit: 2015-01-12 | Discharge: 2015-01-12 | Disposition: A | Discharge status: Home / Self Care | Payer: 59 | Source: Ambulatory Visit | Attending: Radiation Oncology | Admitting: Radiation Oncology

## 2015-01-12 ENCOUNTER — Ambulatory Visit: Payer: 59

## 2015-01-12 DIAGNOSIS — R1903 Right lower quadrant abdominal swelling, mass and lump: Secondary | ICD-10-CM

## 2015-01-12 DIAGNOSIS — Z510 Encounter for antineoplastic radiation therapy: Secondary | ICD-10-CM | POA: Diagnosis not present

## 2015-01-12 MED ORDER — RADIAPLEXRX EX GEL
Freq: Once | CUTANEOUS | Status: AC
  Administered 2015-01-12: 14:00:00 via TOPICAL

## 2015-01-12 NOTE — Progress Notes (Signed)
Pt here for patient teaching.  Pt given Radiation and You booklet, skin care instructions, Alra deodorant and Radiaplex gel. Reviewed areas of pertinence such as diarrhea, fatigue, nausea and vomiting, skin changes and urinary and bladder changes . Pt able to give teach back of to pat skin, use unscented/gentle soap, use baby wipes, have Imodium on hand, drink plenty of water and sitz bath,apply Radiaplex bid and avoid applying anything to skin within 4 hours of treatment. Pt demonstrated understanding and verbalizes understanding of information given and will contact nursing with any questions or concerns.

## 2015-01-13 ENCOUNTER — Ambulatory Visit
Admission: RE | Admit: 2015-01-13 | Discharge: 2015-01-13 | Disposition: A | Discharge status: Home / Self Care | Payer: 59 | Source: Ambulatory Visit | Attending: Radiation Oncology | Admitting: Radiation Oncology

## 2015-01-13 DIAGNOSIS — Z510 Encounter for antineoplastic radiation therapy: Secondary | ICD-10-CM | POA: Diagnosis not present

## 2015-01-16 ENCOUNTER — Ambulatory Visit
Admission: RE | Admit: 2015-01-16 | Discharge: 2015-01-16 | Disposition: A | Discharge status: Home / Self Care | Payer: 59 | Source: Ambulatory Visit | Attending: Radiation Oncology | Admitting: Radiation Oncology

## 2015-01-16 ENCOUNTER — Encounter: Payer: Self-pay | Admitting: Radiation Oncology

## 2015-01-16 VITALS — BP 101/63 | HR 81 | Temp 98.0°F | Ht 67.0 in | Wt 140.5 lb

## 2015-01-16 DIAGNOSIS — M729 Fibroblastic disorder, unspecified: Secondary | ICD-10-CM

## 2015-01-16 DIAGNOSIS — Z510 Encounter for antineoplastic radiation therapy: Secondary | ICD-10-CM | POA: Diagnosis not present

## 2015-01-16 NOTE — Progress Notes (Signed)
Ms. Hine is concerned about an increased bulge in the right lower abdominal region since last week.  This area is firm to touch and the entire filed demonstrates erythema.  She also reports that since this weekend she has experienced   More frequent stools, no diarrhea.  She started her menstrual cycle.

## 2015-01-16 NOTE — Progress Notes (Signed)
Weekly Management Note:  Site: Right lower abdominal wall Current Dose:  1080  cGy Projected Dose: 5040  cGy  Narrative: The patient is seen today for routine under treatment assessment. CBCT/MVCT images/port films were reviewed. The chart was reviewed.   She feels that there may be more of a "bulge" along her right lower abdomen within her treatment field.  There is no pain.  Physical Examination:  Filed Vitals:   01/16/15 1412  BP: 101/63  Pulse: 81  Temp: 98 F (36.7 C)  .  Weight: 140 lb 8 oz (63.73 kg).  On examination there is slight erythema within her treatment field.  There is some "puffiness" but no mass or hernia appreciated.  Abdomen is soft.    Impression: Tolerating radiation therapy well.  Plan: Continue radiation therapy as planned.

## 2015-01-17 ENCOUNTER — Ambulatory Visit
Admission: RE | Admit: 2015-01-17 | Discharge: 2015-01-17 | Disposition: A | Discharge status: Home / Self Care | Payer: 59 | Source: Ambulatory Visit | Attending: Radiation Oncology | Admitting: Radiation Oncology

## 2015-01-17 DIAGNOSIS — Z510 Encounter for antineoplastic radiation therapy: Secondary | ICD-10-CM | POA: Diagnosis not present

## 2015-01-18 ENCOUNTER — Ambulatory Visit
Admission: RE | Admit: 2015-01-18 | Discharge: 2015-01-18 | Disposition: A | Discharge status: Home / Self Care | Payer: 59 | Source: Ambulatory Visit | Attending: Radiation Oncology | Admitting: Radiation Oncology

## 2015-01-18 DIAGNOSIS — Z510 Encounter for antineoplastic radiation therapy: Secondary | ICD-10-CM | POA: Diagnosis not present

## 2015-01-19 ENCOUNTER — Ambulatory Visit
Admission: RE | Admit: 2015-01-19 | Discharge: 2015-01-19 | Disposition: A | Discharge status: Home / Self Care | Payer: 59 | Source: Ambulatory Visit | Attending: Radiation Oncology | Admitting: Radiation Oncology

## 2015-01-19 DIAGNOSIS — Z510 Encounter for antineoplastic radiation therapy: Secondary | ICD-10-CM | POA: Diagnosis not present

## 2015-01-20 ENCOUNTER — Ambulatory Visit
Admission: RE | Admit: 2015-01-20 | Discharge: 2015-01-20 | Disposition: A | Discharge status: Home / Self Care | Payer: 59 | Source: Ambulatory Visit | Attending: Radiation Oncology | Admitting: Radiation Oncology

## 2015-01-20 DIAGNOSIS — Z510 Encounter for antineoplastic radiation therapy: Secondary | ICD-10-CM | POA: Diagnosis not present

## 2015-01-23 ENCOUNTER — Ambulatory Visit
Admission: RE | Admit: 2015-01-23 | Discharge: 2015-01-23 | Disposition: A | Discharge status: Home / Self Care | Payer: 59 | Source: Ambulatory Visit | Attending: Radiation Oncology | Admitting: Radiation Oncology

## 2015-01-23 ENCOUNTER — Encounter: Payer: Self-pay | Admitting: Radiation Oncology

## 2015-01-23 VITALS — BP 101/51 | HR 77 | Temp 98.2°F | Ht 67.0 in | Wt 141.9 lb

## 2015-01-23 DIAGNOSIS — M729 Fibroblastic disorder, unspecified: Secondary | ICD-10-CM

## 2015-01-23 DIAGNOSIS — Z510 Encounter for antineoplastic radiation therapy: Secondary | ICD-10-CM | POA: Diagnosis not present

## 2015-01-23 NOTE — Progress Notes (Signed)
Weekly Management Note:  Site: Right lower abdominal wall Current Dose:  1980  cGy Projected Dose: 5040  cGy  Narrative: The patient is seen today for routine under treatment assessment. CBCT/MVCT images/port films were reviewed. The chart was reviewed.   She is without complaints today except for slight tenderness along her skin.  She does not appreciate any change from last week.  Physical Examination:  Filed Vitals:   01/23/15 1431  BP: 101/51  Pulse: 77  Temp: 98.2 F (36.8 C)  .  Weight: 141 lb 14.4 oz (64.365 kg).  There is erythema the skin within the treatment field but no masses are appreciated.  There is no hernia or significant bulging.  Impression: Tolerating radiation therapy well.  Plan: Continue radiation therapy as planned.

## 2015-01-23 NOTE — Progress Notes (Signed)
Mrs. Sheila Love has received 11 fractions to her right lower abdomen.  She remains concerned about the noticeable bulging that has developed in this area, which is unchanged since last week.  Not hyperpigmentation/pinkness of her treatment field with intact and soft skin.  States tenderness of tx field.  Denies any fatigue

## 2015-01-24 ENCOUNTER — Ambulatory Visit
Admission: RE | Admit: 2015-01-24 | Discharge: 2015-01-24 | Disposition: A | Discharge status: Home / Self Care | Payer: 59 | Source: Ambulatory Visit | Attending: Radiation Oncology | Admitting: Radiation Oncology

## 2015-01-24 DIAGNOSIS — Z510 Encounter for antineoplastic radiation therapy: Secondary | ICD-10-CM | POA: Diagnosis not present

## 2015-01-25 ENCOUNTER — Ambulatory Visit
Admission: RE | Admit: 2015-01-25 | Discharge: 2015-01-25 | Disposition: A | Discharge status: Home / Self Care | Payer: 59 | Source: Ambulatory Visit | Attending: Radiation Oncology | Admitting: Radiation Oncology

## 2015-01-25 DIAGNOSIS — Z510 Encounter for antineoplastic radiation therapy: Secondary | ICD-10-CM | POA: Diagnosis not present

## 2015-01-26 ENCOUNTER — Ambulatory Visit
Admission: RE | Admit: 2015-01-26 | Discharge: 2015-01-26 | Disposition: A | Discharge status: Home / Self Care | Payer: 59 | Source: Ambulatory Visit | Attending: Radiation Oncology | Admitting: Radiation Oncology

## 2015-01-26 DIAGNOSIS — Z510 Encounter for antineoplastic radiation therapy: Secondary | ICD-10-CM | POA: Diagnosis not present

## 2015-01-27 ENCOUNTER — Ambulatory Visit
Admission: RE | Admit: 2015-01-27 | Discharge: 2015-01-27 | Disposition: A | Discharge status: Home / Self Care | Payer: 59 | Source: Ambulatory Visit | Attending: Radiation Oncology | Admitting: Radiation Oncology

## 2015-01-27 DIAGNOSIS — Z510 Encounter for antineoplastic radiation therapy: Secondary | ICD-10-CM | POA: Diagnosis not present

## 2015-01-30 ENCOUNTER — Encounter: Payer: Self-pay | Admitting: Radiation Oncology

## 2015-01-30 ENCOUNTER — Ambulatory Visit
Admission: RE | Admit: 2015-01-30 | Discharge: 2015-01-30 | Disposition: A | Discharge status: Home / Self Care | Payer: 59 | Source: Ambulatory Visit | Attending: Radiation Oncology | Admitting: Radiation Oncology

## 2015-01-30 VITALS — BP 109/63 | HR 83 | Temp 98.0°F | Resp 20 | Wt 141.3 lb

## 2015-01-30 DIAGNOSIS — Z510 Encounter for antineoplastic radiation therapy: Secondary | ICD-10-CM | POA: Diagnosis not present

## 2015-01-30 DIAGNOSIS — M729 Fibroblastic disorder, unspecified: Secondary | ICD-10-CM

## 2015-01-30 NOTE — Progress Notes (Signed)
Weekly rad txs gastric 16/28 completed, mild erythema right mid abdomen, using radiplex bid, appetite good, energy level  Goodno pain no nausea, regular bowel movements 2:08 PM BP 109/63 mmHg  Pulse 83  Temp(Src) 98 F (36.7 C) (Oral)  Resp 20  Wt 141 lb 4.8 oz (64.093 kg). Wt Readings from Last 3 Encounters:  12/06/14 140 lb 11.2 oz (63.821 kg)  11/16/14 136 lb 4 oz (61.803 kg)  09/28/14 135 lb (61.236 kg)

## 2015-01-30 NOTE — Progress Notes (Signed)
Weekly Management Note:  Site: Right lower abdominal wall Current Dose:  2880  cGy Projected Dose: 5040  cGy  Narrative: The patient is seen today for routine under treatment assessment. CBCT/MVCT images/port films were reviewed. The chart was reviewed.  She is without complaints today.   Physical Examination:  Filed Vitals:   01/30/15 1405  BP: 109/63  Pulse: 83  Temp: 98 F (36.7 C)  Resp: 20  .  Weight: 141 lb 4.8 oz (64.093 kg).  There is mild erythema along the right lower abdominal wall which is somewhat more intense along "stretch marks".  No desquamation.   Impression: Tolerating radiation therapy well.  Plan: Continue radiation therapy as planned.

## 2015-01-31 ENCOUNTER — Ambulatory Visit
Admission: RE | Admit: 2015-01-31 | Discharge: 2015-01-31 | Disposition: A | Discharge status: Home / Self Care | Payer: 59 | Source: Ambulatory Visit | Attending: Radiation Oncology | Admitting: Radiation Oncology

## 2015-01-31 DIAGNOSIS — Z510 Encounter for antineoplastic radiation therapy: Secondary | ICD-10-CM | POA: Diagnosis not present

## 2015-01-31 NOTE — Addendum Note (Signed)
Encounter addended by: Benn Moulder, RN on: 01/31/2015  5:06 PM<BR>     Documentation filed: Demographics Visit

## 2015-02-01 ENCOUNTER — Ambulatory Visit
Admission: RE | Admit: 2015-02-01 | Discharge: 2015-02-01 | Disposition: A | Discharge status: Home / Self Care | Payer: 59 | Source: Ambulatory Visit | Attending: Radiation Oncology | Admitting: Radiation Oncology

## 2015-02-01 DIAGNOSIS — Z510 Encounter for antineoplastic radiation therapy: Secondary | ICD-10-CM | POA: Diagnosis not present

## 2015-02-02 ENCOUNTER — Ambulatory Visit
Admission: RE | Admit: 2015-02-02 | Discharge: 2015-02-02 | Disposition: A | Discharge status: Home / Self Care | Payer: 59 | Source: Ambulatory Visit | Attending: Radiation Oncology | Admitting: Radiation Oncology

## 2015-02-02 DIAGNOSIS — Z510 Encounter for antineoplastic radiation therapy: Secondary | ICD-10-CM | POA: Diagnosis not present

## 2015-02-03 ENCOUNTER — Ambulatory Visit
Admission: RE | Admit: 2015-02-03 | Discharge: 2015-02-03 | Disposition: A | Discharge status: Home / Self Care | Payer: 59 | Source: Ambulatory Visit | Attending: Radiation Oncology | Admitting: Radiation Oncology

## 2015-02-03 DIAGNOSIS — Z510 Encounter for antineoplastic radiation therapy: Secondary | ICD-10-CM | POA: Diagnosis not present

## 2015-02-03 NOTE — Progress Notes (Signed)
Patient brought to nursing for assessment of abdomen.Treatment field has some bulging/edema.Area is red.Continue application of radiaplex twice daily.Informed it's possible to have some swelling and that we will assess again on Monday during regular weekly assessment.

## 2015-02-06 ENCOUNTER — Ambulatory Visit
Admission: RE | Admit: 2015-02-06 | Discharge: 2015-02-06 | Disposition: A | Discharge status: Home / Self Care | Payer: 59 | Source: Ambulatory Visit | Attending: Radiation Oncology | Admitting: Radiation Oncology

## 2015-02-06 VITALS — BP 94/49 | HR 82 | Temp 98.3°F | Resp 12 | Wt 140.2 lb

## 2015-02-06 DIAGNOSIS — M729 Fibroblastic disorder, unspecified: Secondary | ICD-10-CM

## 2015-02-06 DIAGNOSIS — Z510 Encounter for antineoplastic radiation therapy: Secondary | ICD-10-CM | POA: Diagnosis not present

## 2015-02-06 NOTE — Progress Notes (Signed)
She rates her pain as a 2 on a scale of 0-10. intermittent, dull and aching over abdomen. Pt complains of fatigue. Abdominal Pain, Pt reports soft bowel movements everyday.  Noted mild erythema and hyperpigmentation to treatment area as well as some bulging.  Pt continues with Radiaplex. BP 94/49 mmHg  Pulse 82  Temp(Src) 98.3 F (36.8 C) (Oral)  Resp 12  Wt 140 lb 3.2 oz (63.594 kg)  SpO2 100%

## 2015-02-06 NOTE — Progress Notes (Signed)
Weekly Management Note:  Site: Right lower abdominal wall Current Dose:  3780  cGy Projected Dose: 5040  cGy  Narrative: The patient is seen today for routine under treatment assessment. CBCT/MVCT images/port films were reviewed. The chart was reviewed.   She does report mild discomfort along her right lower abdominal wall.  She uses Radioplex gel.  Physical Examination:  Filed Vitals:   02/06/15 1404  BP: 94/49  Pulse: 82  Temp:   Resp:   .  Weight: 140 lb 3.2 oz (63.594 kg).  There is slight soft tissue swelling and erythema but no areas of desquamation.  Impression: Tolerating radiation therapy well.  Plan: Continue radiation therapy as planned.

## 2015-02-07 ENCOUNTER — Ambulatory Visit
Admission: RE | Admit: 2015-02-07 | Discharge: 2015-02-07 | Disposition: A | Discharge status: Home / Self Care | Payer: 59 | Source: Ambulatory Visit | Attending: Radiation Oncology | Admitting: Radiation Oncology

## 2015-02-07 DIAGNOSIS — Z510 Encounter for antineoplastic radiation therapy: Secondary | ICD-10-CM | POA: Diagnosis not present

## 2015-02-08 ENCOUNTER — Ambulatory Visit
Admission: RE | Admit: 2015-02-08 | Discharge: 2015-02-08 | Disposition: A | Discharge status: Home / Self Care | Payer: 59 | Source: Ambulatory Visit | Attending: Radiation Oncology | Admitting: Radiation Oncology

## 2015-02-08 DIAGNOSIS — Z510 Encounter for antineoplastic radiation therapy: Secondary | ICD-10-CM | POA: Diagnosis not present

## 2015-02-09 ENCOUNTER — Ambulatory Visit: Payer: 59 | Admitting: Radiation Oncology

## 2015-02-09 ENCOUNTER — Ambulatory Visit
Admission: RE | Admit: 2015-02-09 | Discharge: 2015-02-09 | Disposition: A | Discharge status: Home / Self Care | Payer: 59 | Source: Ambulatory Visit | Attending: Radiation Oncology | Admitting: Radiation Oncology

## 2015-02-09 DIAGNOSIS — Z510 Encounter for antineoplastic radiation therapy: Secondary | ICD-10-CM | POA: Diagnosis not present

## 2015-02-10 ENCOUNTER — Ambulatory Visit: Payer: 59

## 2015-02-10 ENCOUNTER — Ambulatory Visit
Admission: RE | Admit: 2015-02-10 | Discharge: 2015-02-10 | Disposition: A | Discharge status: Home / Self Care | Payer: 59 | Source: Ambulatory Visit | Attending: Radiation Oncology | Admitting: Radiation Oncology

## 2015-02-10 DIAGNOSIS — Z510 Encounter for antineoplastic radiation therapy: Secondary | ICD-10-CM | POA: Diagnosis not present

## 2015-02-13 ENCOUNTER — Ambulatory Visit
Admission: RE | Admit: 2015-02-13 | Discharge: 2015-02-13 | Disposition: A | Discharge status: Home / Self Care | Payer: 59 | Source: Ambulatory Visit | Attending: Radiation Oncology | Admitting: Radiation Oncology

## 2015-02-13 VITALS — BP 102/61 | HR 61 | Temp 98.3°F | Wt 141.6 lb

## 2015-02-13 DIAGNOSIS — Z510 Encounter for antineoplastic radiation therapy: Secondary | ICD-10-CM | POA: Insufficient documentation

## 2015-02-13 DIAGNOSIS — M729 Fibroblastic disorder, unspecified: Secondary | ICD-10-CM | POA: Diagnosis not present

## 2015-02-13 DIAGNOSIS — R1903 Right lower quadrant abdominal swelling, mass and lump: Secondary | ICD-10-CM | POA: Diagnosis not present

## 2015-02-13 DIAGNOSIS — L299 Pruritus, unspecified: Secondary | ICD-10-CM | POA: Insufficient documentation

## 2015-02-13 DIAGNOSIS — L539 Erythematous condition, unspecified: Secondary | ICD-10-CM | POA: Diagnosis not present

## 2015-02-13 DIAGNOSIS — L598 Other specified disorders of the skin and subcutaneous tissue related to radiation: Secondary | ICD-10-CM | POA: Insufficient documentation

## 2015-02-13 MED ORDER — BIAFINE EX EMUL
CUTANEOUS | Status: DC | PRN
  Administered 2015-02-13: 16:00:00 via TOPICAL

## 2015-02-13 NOTE — Progress Notes (Signed)
Weekly assessment of radiation to abdomen.Completed 26 of 28 treatments to abdomen/right groin.Area is tender and red with mild itching.[place] to biafine today to apply 2 to 3 times daily.To schedule one month follow up as patient completes treatment on Wednesday.

## 2015-02-13 NOTE — Addendum Note (Signed)
Encounter addended by: Norm Salt, RN on: 02/13/2015  3:35 PM<BR>     Documentation filed: Inpatient MAR

## 2015-02-13 NOTE — Progress Notes (Signed)
Weekly Management Note:  Site: Right lower abdominal wall Current Dose:  4680  cGy Projected Dose: 5040  cGy  Narrative: The patient is seen today for routine under treatment assessment. CBCT/MVCT images/port films were reviewed. The chart was reviewed.   She is without new complaints today except for mild pruritus.  She was given Biafine cream to use to 3 times a day.  She will finish her radiation therapy this Wednesday.  Physical Examination:  Filed Vitals:   02/13/15 1453  BP: 102/61  Pulse: 61  Temp: 98.3 F (36.8 C)  .  Weight: 141 lb 9.6 oz (64.229 kg).  There is erythema along the right lower abdomen with no desquamation.  There is mild to moderate edema.  No obvious hernia.  Impression: Tolerating radiation therapy well.  Plan: Continue radiation therapy as planned.

## 2015-02-14 ENCOUNTER — Ambulatory Visit
Admission: RE | Admit: 2015-02-14 | Discharge: 2015-02-14 | Disposition: A | Discharge status: Home / Self Care | Payer: 59 | Source: Ambulatory Visit | Attending: Radiation Oncology | Admitting: Radiation Oncology

## 2015-02-14 DIAGNOSIS — Z510 Encounter for antineoplastic radiation therapy: Secondary | ICD-10-CM | POA: Diagnosis not present

## 2015-02-15 ENCOUNTER — Ambulatory Visit
Admission: RE | Admit: 2015-02-15 | Discharge: 2015-02-15 | Disposition: A | Discharge status: Home / Self Care | Payer: 59 | Source: Ambulatory Visit | Attending: Radiation Oncology | Admitting: Radiation Oncology

## 2015-02-15 DIAGNOSIS — Z510 Encounter for antineoplastic radiation therapy: Secondary | ICD-10-CM | POA: Diagnosis not present

## 2015-02-18 ENCOUNTER — Encounter: Payer: Self-pay | Admitting: Radiation Oncology

## 2015-02-18 NOTE — Progress Notes (Signed)
[facility] End of Treatment Note  Name:Sheila Love  Date: 02/18/2015 BMS:[id number] DOB:Jan 01, 1981   Status:outpatient    CC: Lester Kinsman, PA-C  Dr. Fanny Skates  REFERRING PHYSICIAN:   Dr. 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 tolerated treatment well with the expected degree of erythema of her skin and patchy dry desquamation along the right lower abdominal wall.  She did not have any GI toxicity.                          PLAN: Routine followup in one month. Patient instructed to call if questions or worsening complaints in interim.

## 2015-02-21 ENCOUNTER — Encounter: Payer: Self-pay | Admitting: Radiation Oncology

## 2015-02-27 ENCOUNTER — Telehealth: Payer: Self-pay | Admitting: *Deleted

## 2015-02-27 ENCOUNTER — Telehealth: Payer: Self-pay

## 2015-02-27 NOTE — Telephone Encounter (Signed)
"  I finished RT two weeks ago.  Abdomen is healing but is still red.  I'm almost done  With the  Biafine.  Do I go back to this or do I use radioplex cream.  I have a little of this left over."  Return number is [phone].

## 2015-02-27 NOTE — Telephone Encounter (Signed)
Patient called to state that she has run out of biafine  And ask  if is ok to use radiaplex or aloe instead as abdomen is still red.Informed it is ok for her use use either.

## 2015-02-27 NOTE — Telephone Encounter (Signed)
I spoke with patient today, and she apparently had a seroma drained by Dr. Dalbert Batman early last week with removal of approximately 50 mL of fluid.  He also aspirated an additional 10-15 mL late last week.  Tape was placed and she developed a moist desquamation the skin where she had placement of tape.  I told her that she can apply antibiotic ointment where she has a moist desquamation, but continues Biafine cream Radioplex gel or even Vaseline elsewhere.

## 2015-03-23 ENCOUNTER — Encounter: Payer: Self-pay | Admitting: Radiation Oncology

## 2015-03-28 ENCOUNTER — Ambulatory Visit
Admission: RE | Admit: 2015-03-28 | Discharge: 2015-03-28 | Disposition: A | Discharge status: Home / Self Care | Payer: 59 | Source: Ambulatory Visit | Attending: Radiation Oncology | Admitting: Radiation Oncology

## 2015-03-28 ENCOUNTER — Encounter: Payer: Self-pay | Admitting: Radiation Oncology

## 2015-03-28 VITALS — BP 114/97 | HR 71 | Temp 97.8°F | Ht 67.0 in | Wt 141.7 lb

## 2015-03-28 DIAGNOSIS — D481 Neoplasm of uncertain behavior of connective and other soft tissue: Secondary | ICD-10-CM

## 2015-03-28 DIAGNOSIS — R1903 Right lower quadrant abdominal swelling, mass and lump: Secondary | ICD-10-CM

## 2015-03-28 HISTORY — DX: Personal history of irradiation: Z92.3

## 2015-03-28 NOTE — Progress Notes (Signed)
Sheila Love is here for reassessment s/p XRT for fibromatosis of the abdomen.,. She denies any pain at this time,  Has mild tanning in the treatment field.  No other voiced concerns.

## 2015-03-28 NOTE — Progress Notes (Signed)
CC: Dr. 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 Philbin returns today approximately 5 weeks following completion of postoperative electron beam radiotherapy in the management of her aggressive fibromatosis of the right lower abdominal wall.  After completion of radiation therapy she had drainage of a seroma on at least 2 occasions.  I think she initially had 50 mL drained and then another 10-15 mL.  She feels that she still has some "puffiness" along her scar.  She'll see Dr. Dalbert Batman for a follow-up visit tomorrow.  Physical examination: On inspection the right lower quadrant of the abdomen there is residual hyperpigmentation and dry desquamation.  There is minimal swelling which could be a residual seroma.  No discreet masses are appreciated.  Impression: Satisfactory progress, although she had development of a seroma during the lateral aspect of her radiation therapy.  She inquired about follow-up when she moves [place] this August.  I told her that Dr. Dalbert Batman could advise on appropriate follow-up in addition to physical examination.  An ultrasound would be the appropriate imaging modality should anything be felt.  Plan: As above.  Follow-up with Dr. Dalbert Batman tomorrow.

## 2015-04-12 IMAGING — US US ABDOMEN LIMITED
1 series · 14 of 21 positions shown · non-contrast
Comparison: None.

CLINICAL DATA: Palpable right lower quadrant mass.

EXAM:
LIMITED ABDOMINAL ULTRASOUND

[Series 1: us abdomen limited · 0.06mm/px · 14 of 21 slices shown]
[im 1/21]
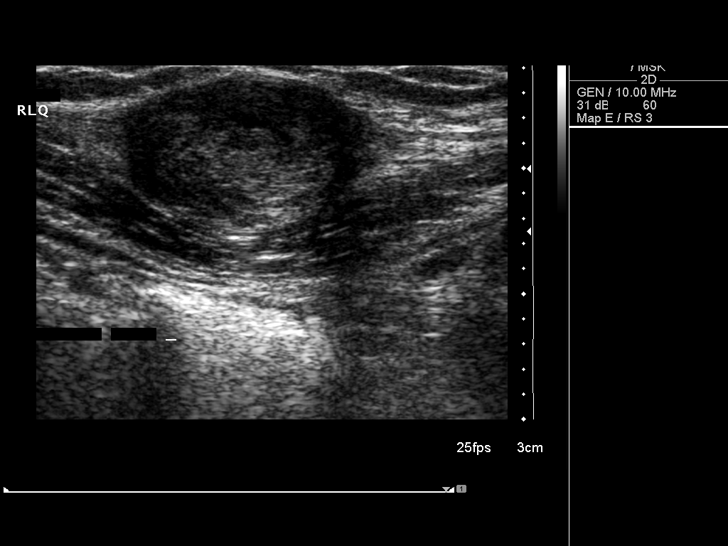
[im 3/21]
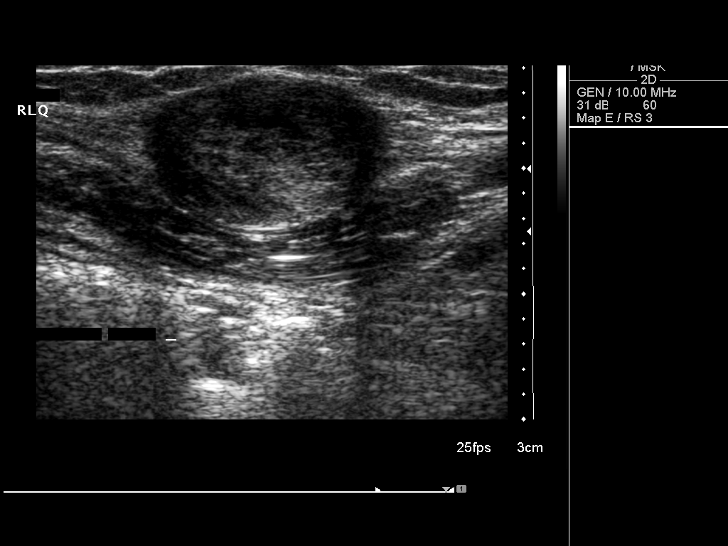
[im 4/21]
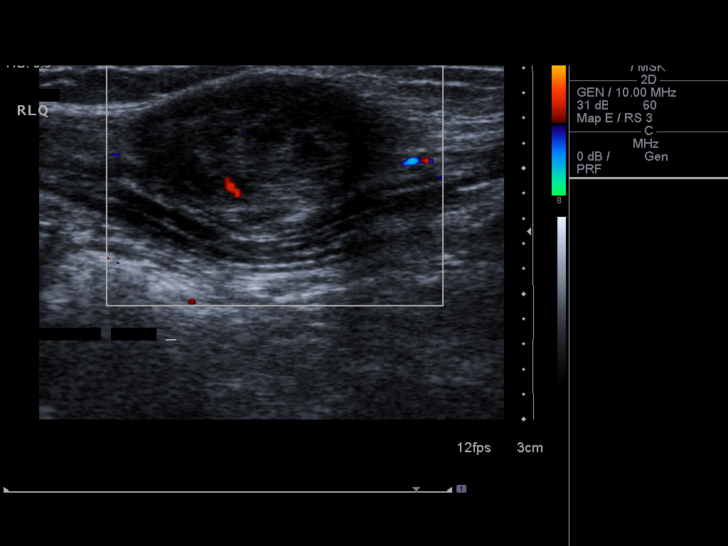
[im 6/21]
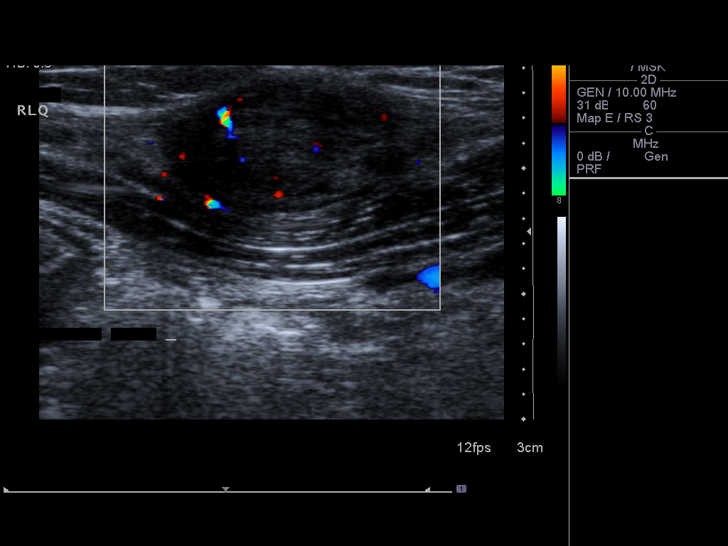
[im 7/21]
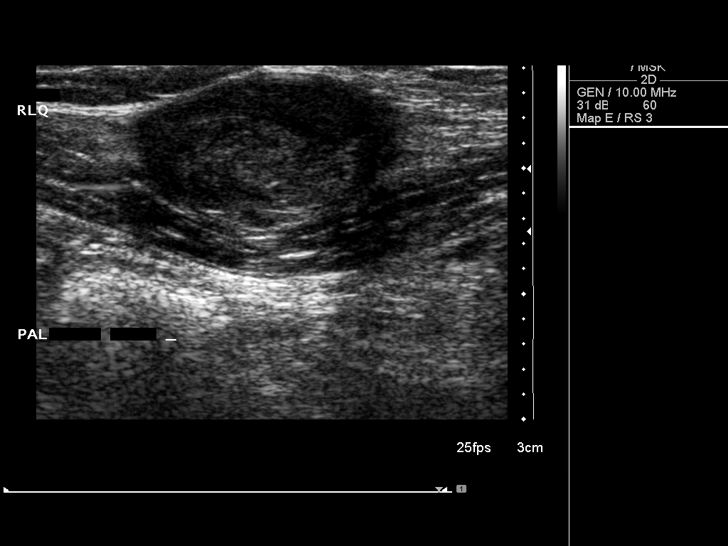
[im 9/21]
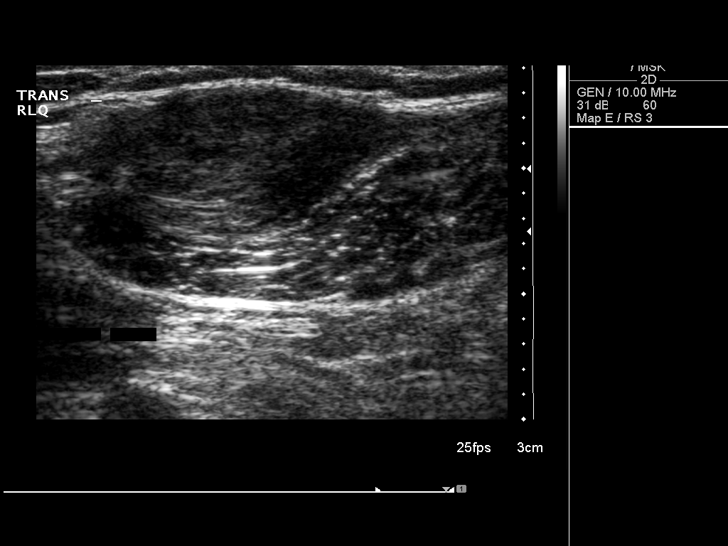
[im 10/21]
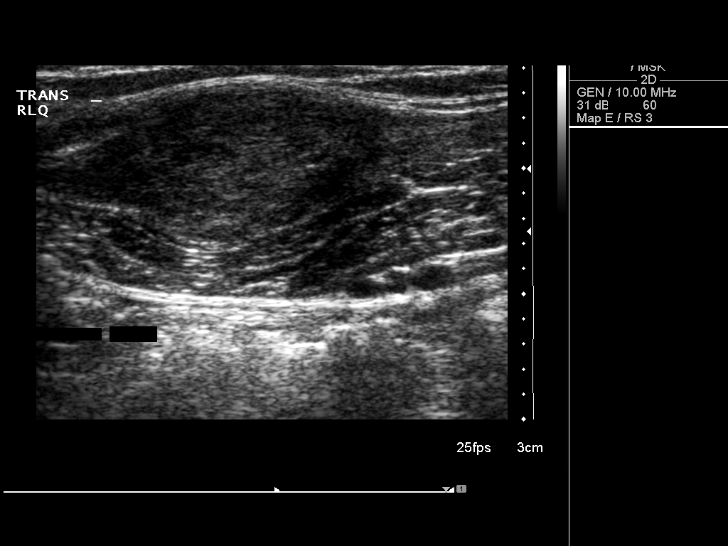
[im 12/21]
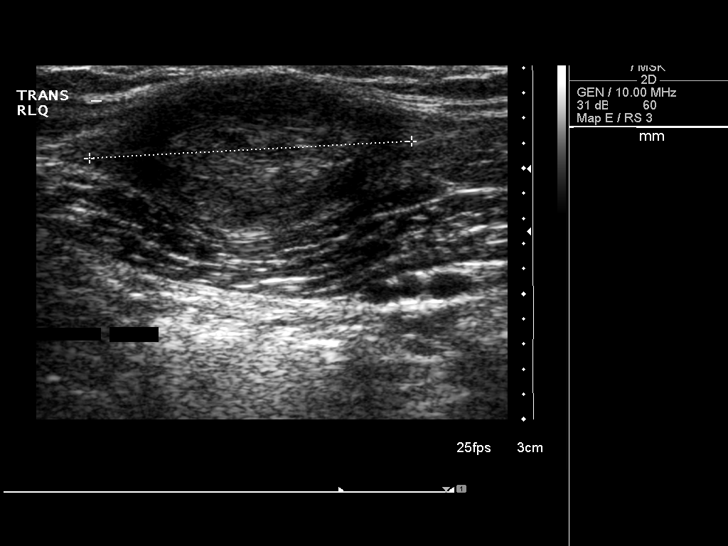
[im 13/21]
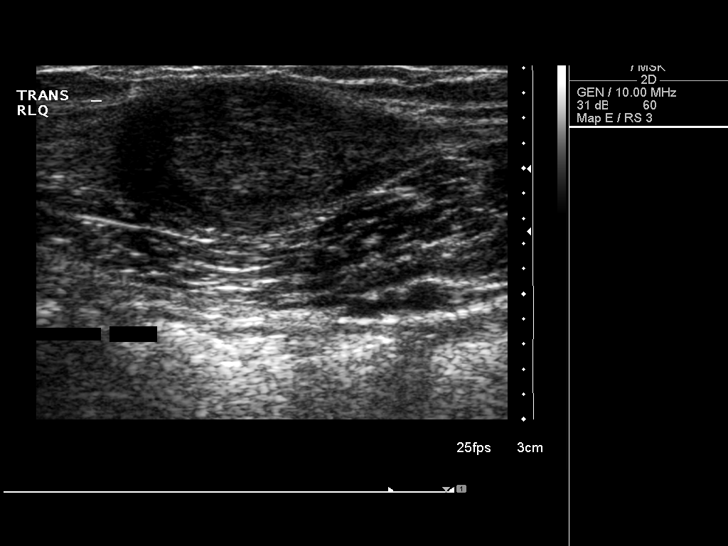
[im 15/21]
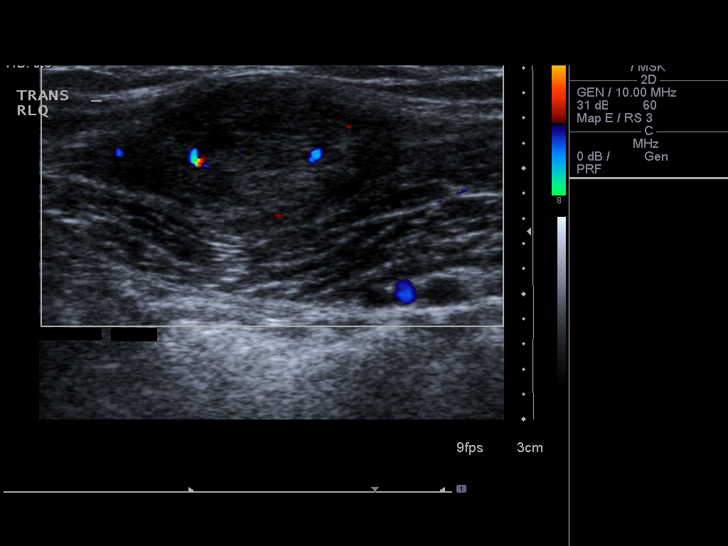
[im 16/21]
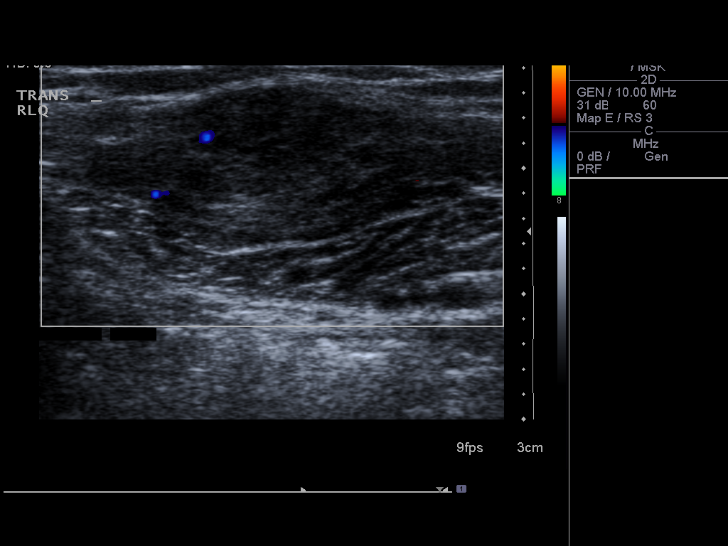
[im 18/21]
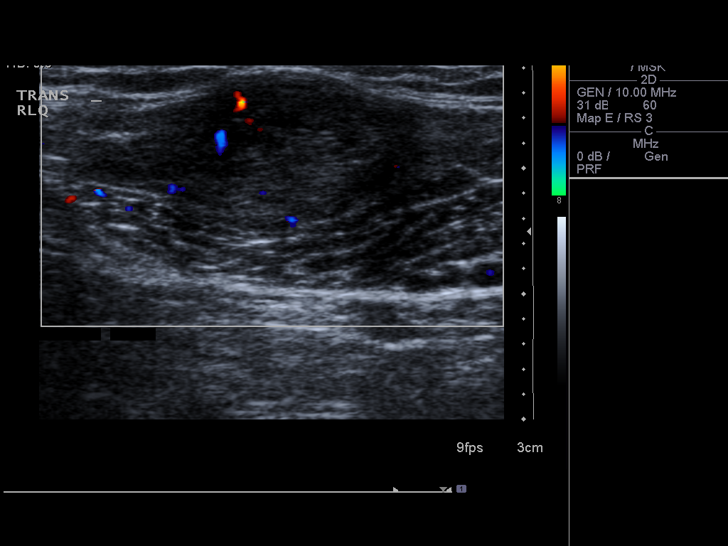
[im 19/21]
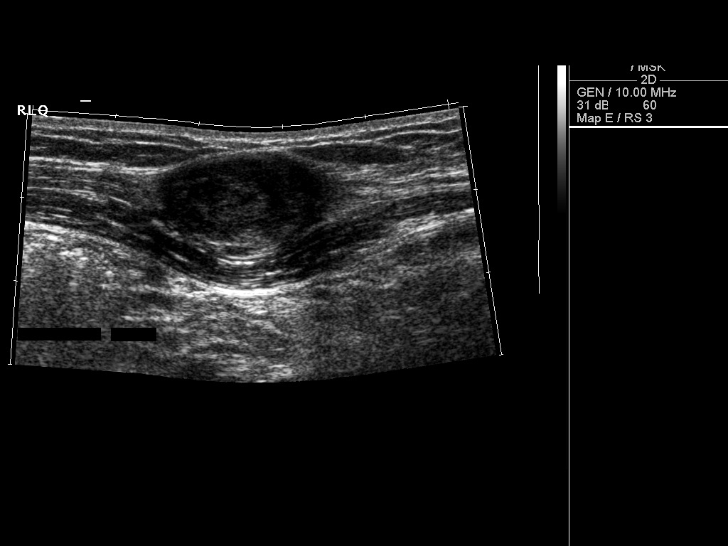
[im 21/21]
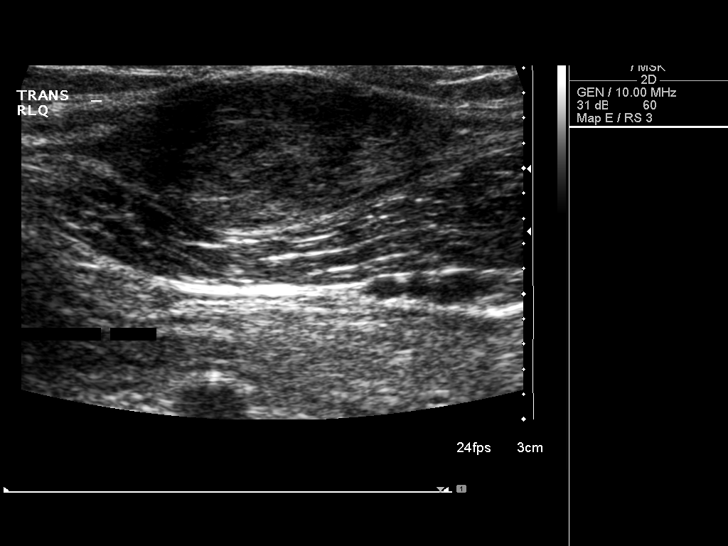

[14 of 21 positions shown; findings below may reference images not displayed]

FINDINGS: The patient's palpable abnormality in the right lower quadrant/
right groin area corresponds to a solid appearing mass measuring
x 1.3 x 2.6 cm. This is in the subcutaneous fat but does have mild
mass effect on the underlying musculature. It does not have the
appearance of a lymph node, cyst or lipoma. It does has a somewhat
oblong shape. A nerve sheath tumor is possible. Endometrial implant
is also possible. MRI without and with contrast may be helpful for
further evaluation.
IMPRESSION: 1. Solid appearing 1.9 x 1.3 x 2.6 cm subcutaneous mass correlating
with the patient's palpable abnormality in the right lower quadrant.
This does not have ultrasound features of a cysts, lipoma or lymph
node.
2. MR imaging without and with contrast may be helpful for further
evaluation.

## 2018-10-28 ENCOUNTER — Other Ambulatory Visit: Payer: Self-pay | Admitting: General Surgery

## 2018-10-28 ENCOUNTER — Other Ambulatory Visit (HOSPITAL_COMMUNITY): Payer: Self-pay | Admitting: General Surgery

## 2018-10-28 DIAGNOSIS — R1903 Right lower quadrant abdominal swelling, mass and lump: Secondary | ICD-10-CM

## 2018-11-10 ENCOUNTER — Other Ambulatory Visit: Payer: Self-pay | Admitting: Radiology

## 2018-11-11 ENCOUNTER — Other Ambulatory Visit: Payer: Self-pay

## 2018-11-11 ENCOUNTER — Ambulatory Visit (HOSPITAL_COMMUNITY)
Admission: RE | Admit: 2018-11-11 | Discharge: 2018-11-11 | Disposition: A | Discharge status: Home / Self Care | Payer: 59 | Source: Ambulatory Visit | Attending: General Surgery | Admitting: General Surgery

## 2018-11-11 ENCOUNTER — Other Ambulatory Visit (HOSPITAL_COMMUNITY): Payer: Self-pay | Admitting: General Surgery

## 2018-11-11 ENCOUNTER — Encounter (HOSPITAL_COMMUNITY): Payer: Self-pay

## 2018-11-11 DIAGNOSIS — R1903 Right lower quadrant abdominal swelling, mass and lump: Secondary | ICD-10-CM

## 2018-11-11 DIAGNOSIS — D763 Other histiocytosis syndromes: Secondary | ICD-10-CM | POA: Diagnosis not present

## 2018-11-11 LAB — CBC
HCT: 41.4 % (ref 36.0–46.0)
Hemoglobin: 13.5 g/dL (ref 12.0–15.0)
MCH: 29.7 pg (ref 26.0–34.0)
MCHC: 32.6 g/dL (ref 30.0–36.0)
MCV: 91.2 fL (ref 80.0–100.0)
Platelets: 277 10*3/uL (ref 150–400)
RBC: 4.54 MIL/uL (ref 3.87–5.11)
RDW: 13.2 % (ref 11.5–15.5)
WBC: 9.6 10*3/uL (ref 4.0–10.5)
nRBC: 0 % (ref 0.0–0.2)

## 2018-11-11 LAB — APTT: aPTT: 33 seconds (ref 24–36)

## 2018-11-11 LAB — PROTIME-INR
INR: 1.15
Prothrombin Time: 14.6 seconds (ref 11.4–15.2)

## 2018-11-11 MED ORDER — SODIUM CHLORIDE 0.9 % IV SOLN: INTRAVENOUS | Status: DC

## 2018-11-11 MED ORDER — LIDOCAINE HCL (PF) 1 % IJ SOLN
INTRAMUSCULAR | Status: AC
  Filled 2018-11-11: qty 30

## 2018-11-11 NOTE — Progress Notes (Signed)
Per IR PA, no IV or urine pregnancy needed. Labs drawn as ordered.

## 2018-11-11 NOTE — Procedures (Signed)
RLQ superficial Soft Tissue Mass Core Bx 18 g times three EBL 0 Comp 0

## 2019-06-14 IMAGING — US IR BIOPSY CORE MUSCLE/SOFT TISSUE
1 series · 14 of 14 positions shown · non-contrast
Comparison: none

INDICATION: Right lower quadrant soft tissue superficial mass

[Series 1: ir biopsy core muscle/soft tissue · 14 of 14 slices shown]
[im 1/14]
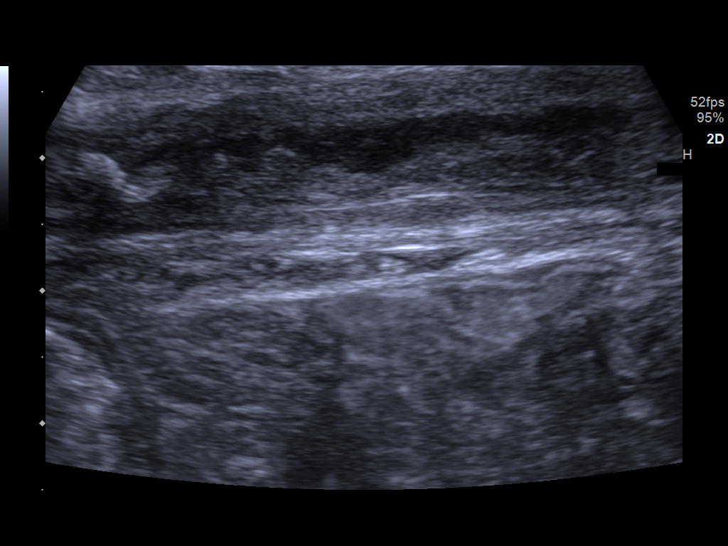
[im 2/14]
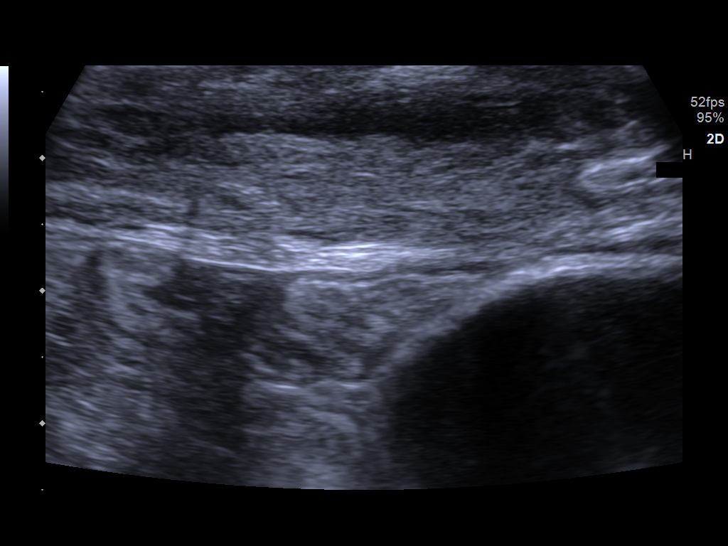
[im 3/14]
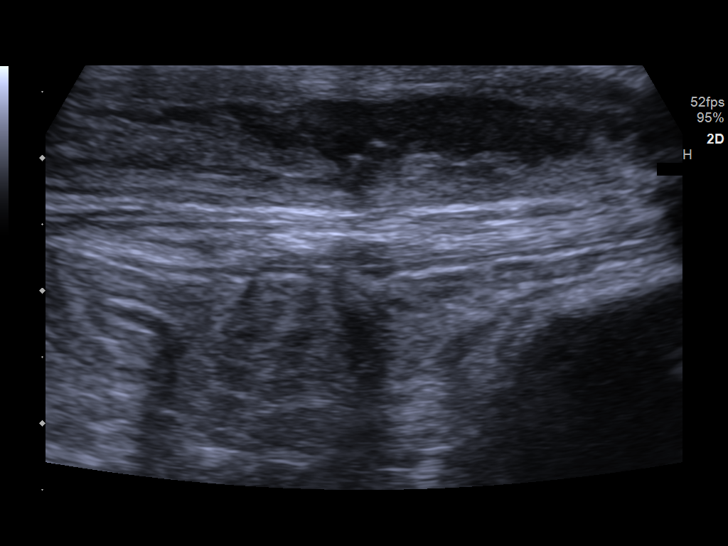
[im 4/14]
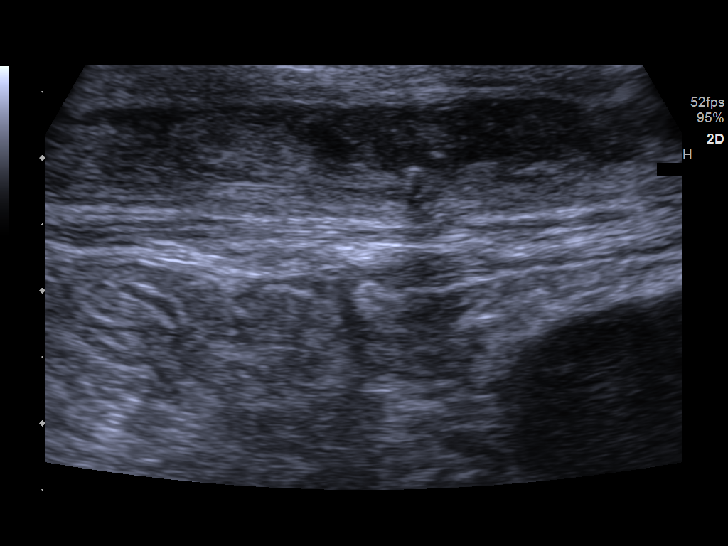
[im 5/14]
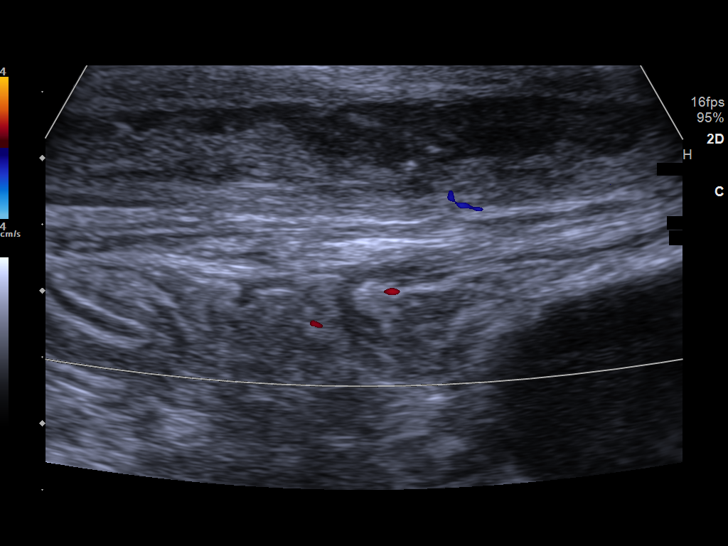
[im 6/14]
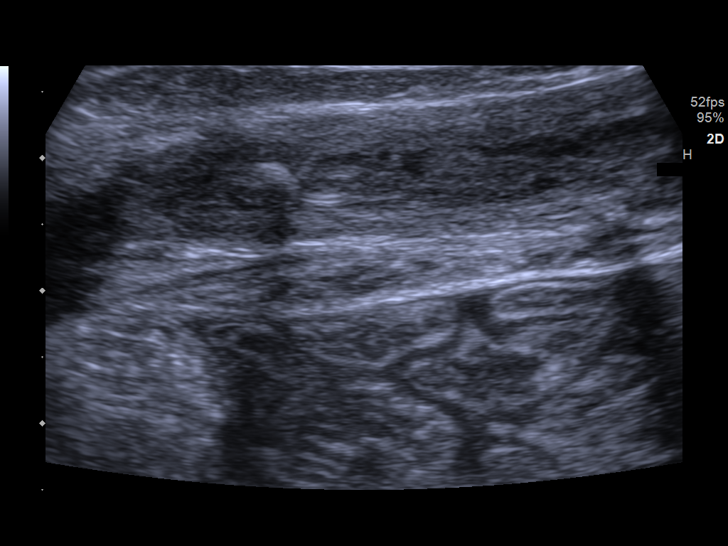
[im 7/14]
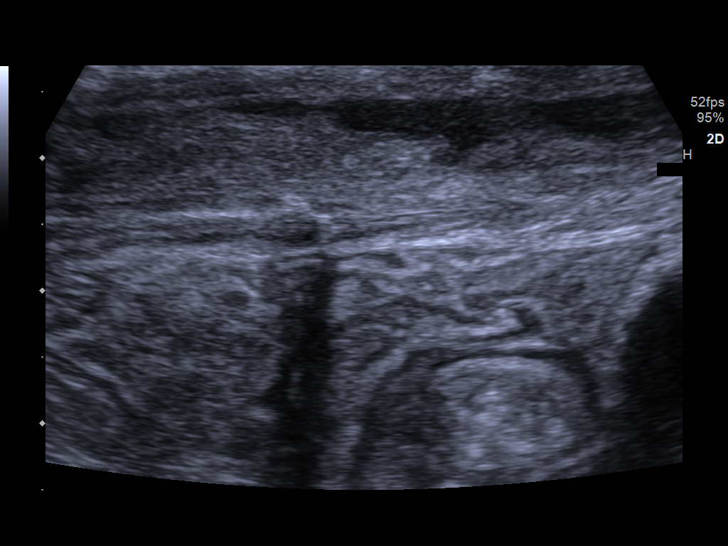
[im 8/14]
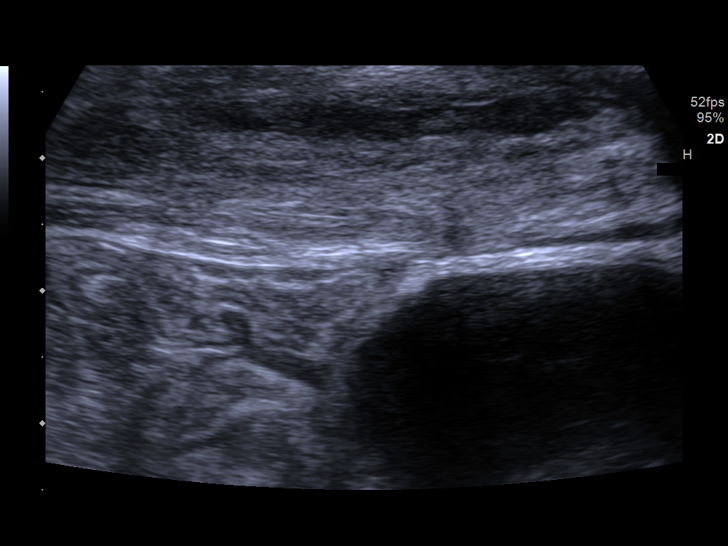
[im 9/14]
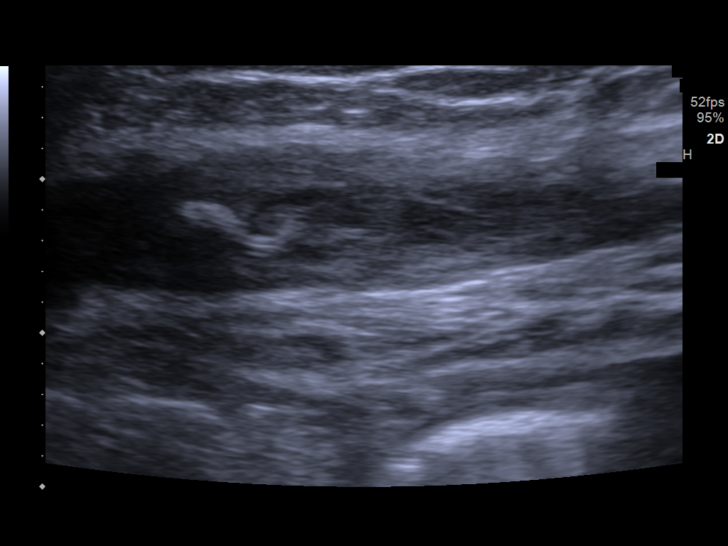
[im 10/14]
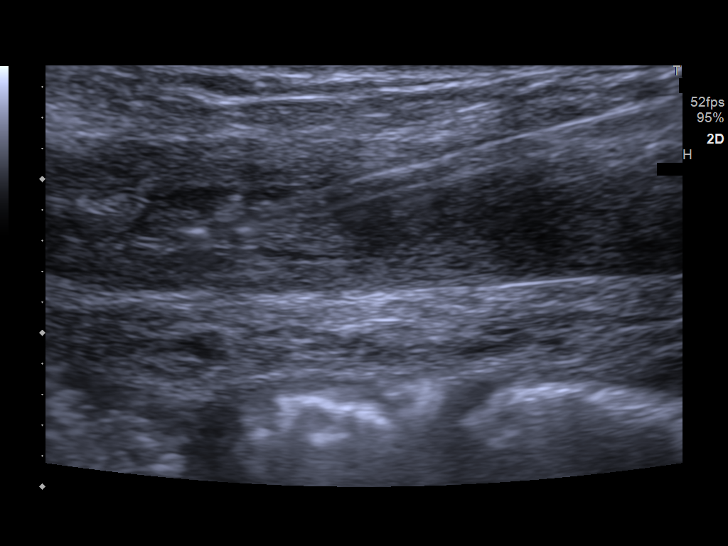
[im 11/14]
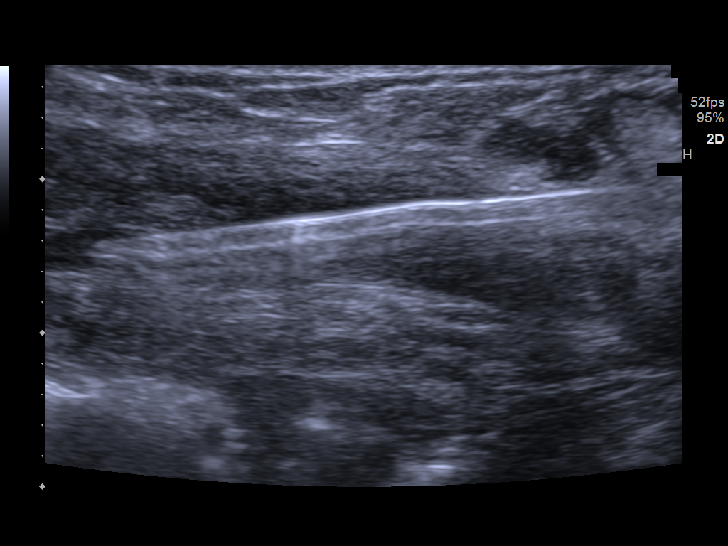
[im 12/14]
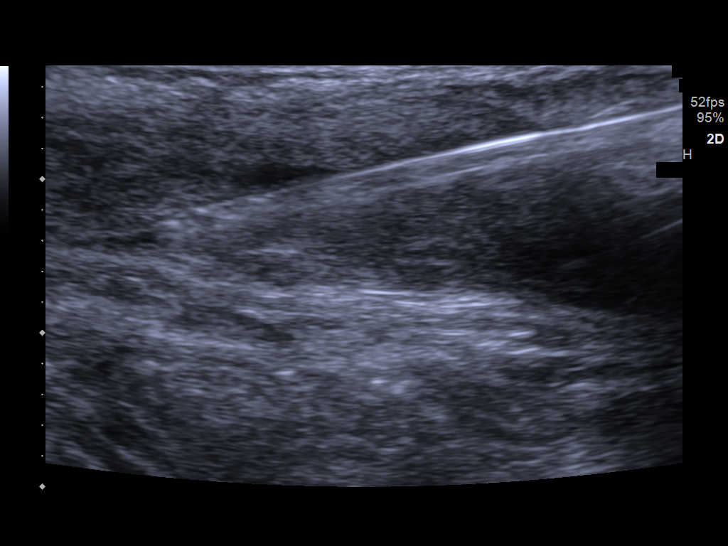
[im 13/14]
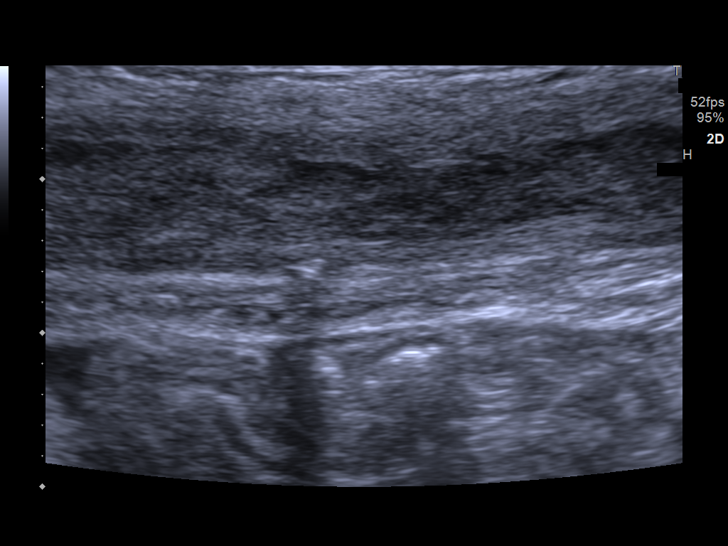
[im 14/14]
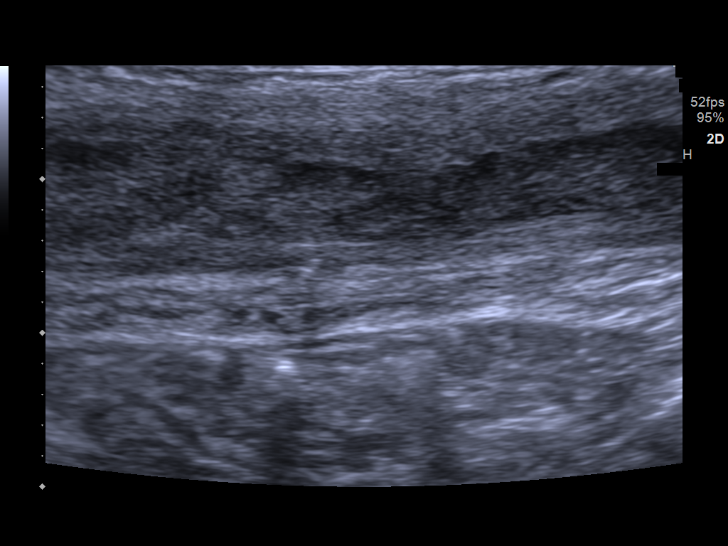

[14 of 14 positions shown; findings below may reference images not displayed]

EXAM:
ULTRASOUND-GUIDED CORE BIOPSY OF A SOFT TISSUE ABDOMINAL MASS

MEDICATIONS:
None.

ANESTHESIA/SEDATION:
None

FLUOROSCOPY TIME:  None

COMPLICATIONS:
None immediate.

PROCEDURE:
Informed written consent was obtained from the patient after a
thorough discussion of the procedural risks, benefits and
alternatives. All questions were addressed. Maximal Sterile Barrier
Technique was utilized including caps, mask, sterile gowns, sterile
gloves, sterile drape, hand hygiene and skin antiseptic. A timeout
was performed prior to the initiation of the procedure.

The superficial mass was localized in the right lower quadrant of
the abdomen within the subcutaneous tissues. 1% lidocaine was
utilized for local anesthesia. Under sonographic guidance, 3 18
gauge core biopsies of the soft tissue superficial mass were
obtained.
FINDINGS: Imaging documents needle placement in the superficial mass in the
right lower quadrant.
IMPRESSION: Successful ultrasound-guided core biopsy of a superficial mass in
the right lower quadrant soft tissues.
